# Patient Record
Sex: Female | Born: 1935 | Race: White | Hispanic: No | Marital: Married | Smoking: Former smoker
Health system: Southern US, Community
[De-identification: ages and names within clinical notes are randomized; demographics above are authoritative.]

## PROBLEM LIST (undated history)

## (undated) DIAGNOSIS — K219 Gastro-esophageal reflux disease without esophagitis: Secondary | ICD-10-CM

## (undated) DIAGNOSIS — J309 Allergic rhinitis, unspecified: Secondary | ICD-10-CM

## (undated) DIAGNOSIS — G459 Transient cerebral ischemic attack, unspecified: Secondary | ICD-10-CM

## (undated) DIAGNOSIS — F32A Depression, unspecified: Secondary | ICD-10-CM

## (undated) DIAGNOSIS — F329 Major depressive disorder, single episode, unspecified: Secondary | ICD-10-CM

## (undated) DIAGNOSIS — K635 Polyp of colon: Secondary | ICD-10-CM

## (undated) DIAGNOSIS — M48062 Spinal stenosis, lumbar region with neurogenic claudication: Secondary | ICD-10-CM

## (undated) DIAGNOSIS — L719 Rosacea, unspecified: Secondary | ICD-10-CM

## (undated) DIAGNOSIS — M797 Fibromyalgia: Secondary | ICD-10-CM

## (undated) DIAGNOSIS — G2581 Restless legs syndrome: Secondary | ICD-10-CM

## (undated) DIAGNOSIS — G4700 Insomnia, unspecified: Secondary | ICD-10-CM

## (undated) DIAGNOSIS — M47812 Spondylosis without myelopathy or radiculopathy, cervical region: Secondary | ICD-10-CM

## (undated) DIAGNOSIS — G603 Idiopathic progressive neuropathy: Secondary | ICD-10-CM

## (undated) DIAGNOSIS — E039 Hypothyroidism, unspecified: Secondary | ICD-10-CM

## (undated) DIAGNOSIS — K5790 Diverticulosis of intestine, part unspecified, without perforation or abscess without bleeding: Secondary | ICD-10-CM

## (undated) DIAGNOSIS — I10 Essential (primary) hypertension: Secondary | ICD-10-CM

## (undated) DIAGNOSIS — M5030 Other cervical disc degeneration, unspecified cervical region: Secondary | ICD-10-CM

## (undated) DIAGNOSIS — M1990 Unspecified osteoarthritis, unspecified site: Secondary | ICD-10-CM

## (undated) DIAGNOSIS — E785 Hyperlipidemia, unspecified: Secondary | ICD-10-CM

## (undated) DIAGNOSIS — K141 Geographic tongue: Secondary | ICD-10-CM

## (undated) HISTORY — DX: Unspecified osteoarthritis, unspecified site: M19.90

## (undated) HISTORY — PX: BILATERAL SALPINGOOPHORECTOMY: SHX1223

## (undated) HISTORY — PX: VESICOVAGINAL FISTULA CLOSURE W/ TAH: SUR271

## (undated) HISTORY — DX: Rosacea, unspecified: L71.9

## (undated) HISTORY — DX: Gastro-esophageal reflux disease without esophagitis: K21.9

## (undated) HISTORY — DX: Geographic tongue: K14.1

## (undated) HISTORY — DX: Diverticulosis of intestine, part unspecified, without perforation or abscess without bleeding: K57.90

## (undated) HISTORY — PX: CHOLECYSTECTOMY: SHX55

## (undated) HISTORY — DX: Insomnia, unspecified: G47.00

## (undated) HISTORY — DX: Hypothyroidism, unspecified: E03.9

## (undated) HISTORY — DX: Idiopathic progressive neuropathy: G60.3

## (undated) HISTORY — DX: Allergic rhinitis, unspecified: J30.9

## (undated) HISTORY — DX: Transient cerebral ischemic attack, unspecified: G45.9

## (undated) HISTORY — DX: Spondylosis without myelopathy or radiculopathy, cervical region: M47.812

## (undated) HISTORY — DX: Other cervical disc degeneration, unspecified cervical region: M50.30

## (undated) HISTORY — PX: OTHER SURGICAL HISTORY: SHX169

## (undated) HISTORY — PX: APPENDECTOMY: SHX54

## (undated) HISTORY — DX: Spinal stenosis, lumbar region with neurogenic claudication: M48.062

## (undated) HISTORY — DX: Polyp of colon: K63.5

## (undated) HISTORY — DX: Restless legs syndrome: G25.81

## (undated) HISTORY — DX: Hyperlipidemia, unspecified: E78.5

---

## 2000-08-30 ENCOUNTER — Other Ambulatory Visit: Admission: RE | Admit: 2000-08-30 | Discharge: 2000-08-30 | Payer: Self-pay | Admitting: Gynecology

## 2001-10-07 ENCOUNTER — Encounter: Payer: Self-pay | Admitting: Orthopedic Surgery

## 2001-10-14 ENCOUNTER — Encounter: Payer: Self-pay | Admitting: Orthopedic Surgery

## 2001-10-14 ENCOUNTER — Inpatient Hospital Stay (HOSPITAL_COMMUNITY): Admission: RE | Admit: 2001-10-14 | Discharge: 2001-10-15 | Payer: Self-pay | Admitting: Orthopedic Surgery

## 2001-12-07 ENCOUNTER — Emergency Department (HOSPITAL_COMMUNITY): Admission: EM | Admit: 2001-12-07 | Discharge: 2001-12-07 | Payer: Self-pay | Admitting: Emergency Medicine

## 2003-09-18 ENCOUNTER — Other Ambulatory Visit: Admission: RE | Admit: 2003-09-18 | Discharge: 2003-09-18 | Payer: Self-pay | Admitting: Gynecology

## 2008-03-21 ENCOUNTER — Inpatient Hospital Stay (HOSPITAL_COMMUNITY): Admission: RE | Admit: 2008-03-21 | Discharge: 2008-03-25 | Payer: Self-pay | Admitting: Orthopedic Surgery

## 2009-04-09 ENCOUNTER — Encounter: Admission: RE | Admit: 2009-04-09 | Discharge: 2009-04-09 | Payer: Self-pay | Admitting: Orthopedic Surgery

## 2010-09-24 ENCOUNTER — Ambulatory Visit
Admission: RE | Admit: 2010-09-24 | Discharge: 2010-09-24 | Payer: Self-pay | Source: Home / Self Care | Attending: Orthopedic Surgery | Admitting: Orthopedic Surgery

## 2010-11-02 ENCOUNTER — Encounter: Payer: Self-pay | Admitting: Orthopedic Surgery

## 2010-11-12 ENCOUNTER — Ambulatory Visit (HOSPITAL_BASED_OUTPATIENT_CLINIC_OR_DEPARTMENT_OTHER)
Admission: RE | Admit: 2010-11-12 | Discharge: 2010-11-12 | Disposition: A | Discharge status: Home / Self Care | Payer: Medicare Other | Source: Ambulatory Visit | Attending: Orthopedic Surgery | Admitting: Orthopedic Surgery

## 2010-11-12 DIAGNOSIS — I10 Essential (primary) hypertension: Secondary | ICD-10-CM | POA: Insufficient documentation

## 2010-11-12 DIAGNOSIS — G2581 Restless legs syndrome: Secondary | ICD-10-CM | POA: Insufficient documentation

## 2010-11-12 DIAGNOSIS — Z96659 Presence of unspecified artificial knee joint: Secondary | ICD-10-CM | POA: Insufficient documentation

## 2010-11-12 DIAGNOSIS — K219 Gastro-esophageal reflux disease without esophagitis: Secondary | ICD-10-CM | POA: Insufficient documentation

## 2010-11-12 DIAGNOSIS — M659 Unspecified synovitis and tenosynovitis, unspecified site: Secondary | ICD-10-CM | POA: Insufficient documentation

## 2010-11-12 DIAGNOSIS — IMO0001 Reserved for inherently not codable concepts without codable children: Secondary | ICD-10-CM | POA: Insufficient documentation

## 2010-11-12 DIAGNOSIS — Z8673 Personal history of transient ischemic attack (TIA), and cerebral infarction without residual deficits: Secondary | ICD-10-CM | POA: Insufficient documentation

## 2010-11-12 LAB — POCT I-STAT, CHEM 8
BUN: 16 mg/dL (ref 6–23)
Calcium, Ion: 1.19 mmol/L (ref 1.12–1.32)
Chloride: 106 mEq/L (ref 96–112)
Glucose, Bld: 95 mg/dL (ref 70–99)

## 2010-11-26 NOTE — Op Note (Signed)
  NAME:  Kiara Taylor, Kiara Taylor              ACCOUNT NO.:  [id number]  MEDICAL RECORD NO.:  [id number]          PATIENT TYPE:  AMB  LOCATION:  [facility]                         FACILITY:  [facility]  PHYSICIAN:  Ollen Gross, M.D.    DATE OF BIRTH:  05-17-1933  DATE OF PROCEDURE:  11/12/2010 DATE OF DISCHARGE:                              OPERATIVE REPORT   PREOPERATIVE DIAGNOSIS:  Right knee hypertrophic synovitis.  POSTOPERATIVE DIAGNOSIS:  Right knee hypertrophic synovitis.  PROCEDURE:  Right knee scope and synovectomy.  SURGEON:  Ollen Gross, MD  ASSISTANT:  None.  ANESTHESIA:  General.  ESTIMATED BLOOD LOSS:  Minimal.  DRAIN:  None.  COMPLICATIONS:  None.  CONDITION:  Stable to Recovery.  BRIEF CLINICAL NOTE:  Ms. [name] is a 77 year old female who had a right total knee arthroplasty done approximately 2 years ago.  She had developed painful popping in the knee.  It was felt that she had hypertrophic synovitis or the patellar clunk syndrome.  She presents now for scope and synovectomy.  PROCEDURE IN DETAIL:  After successful administration of general anesthetic, a tourniquet was placed high on the right thigh, right lower extremity prepped and draped in the usual sterile fashion.  Standard superomedial and inferolateral incisions were made, inflow cannula passed superomedial and camera passed inferolateral.  Arthroscopic visualization proceeds.  There was a large nodule of synovial tissue at the junction of the quadriceps tendon and the patella.  This was blocking the suprapatellar pouch.  A superolateral portal was created with an 11 blade and then the shaver passed through.  This was debrided back to normal tissue with a shaver and then the ArthroCare device was used to seal this off and cauterized the tissue.  We then went down into the infrapatellar area and removed a lot of scar tissue from there.  The median and lateral gutters were essentially free of synovial tissue. The  space was nicely cleared out, compared to the preoperative status. The arthroscopic equipments were then removed from the lateral portals which were closed with interrupted 4-0 nylon.  A 20 cc of 0.25% Marcaine with epinephrine injected through inflow cannula that was removed and that portal closed with nylon.  A bulky sterile dressing was applied and she was awakened and transported to Recovery in stable condition.     Ollen Gross, M.D.     FA/MEDQ  D:  11/12/2010  T:  11/12/2010  Job:  [id number]  Electronically Signed by Ollen Gross M.D. on 11/26/2010 02:53:06 PM

## 2010-12-22 LAB — POCT I-STAT 4, (NA,K, GLUC, HGB,HCT)
HCT: 44 % (ref 36.0–46.0)
Hemoglobin: 15 g/dL (ref 12.0–15.0)
Sodium: 142 mEq/L (ref 135–145)

## 2011-02-24 NOTE — Op Note (Signed)
NAME:  Kiara Taylor, Kiara Taylor              ACCOUNT NO.:  [id number]   MEDICAL RECORD NO.:  [id number]          PATIENT TYPE:  INP   LOCATION:  1605                         [facility]ACILITY:  [facility]   PHYSICIAN:  Ollen Gross, M.D.    DATE OF BIRTH:  1933-09-22   DATE OF PROCEDURE:  03/21/2008  DATE OF DISCHARGE:                               OPERATIVE REPORT   PREOPERATIVE DIAGNOSIS:  Osteoarthritis, right knee.   POSTOPERATIVE DIAGNOSIS:  Osteoarthritis, right knee.   PROCEDURE:  Right total knee arthroplasty.   SURGEON:  Ollen Gross, M.D.   ASSISTANT:  Jamelle Rushing, P.A.   ANESTHESIA:  General with postop Marcaine pain pump.   ESTIMATED BLOOD LOSS:  Minimal.   DRAINS:  None.   TOURNIQUET TIME:  Thirty-two minutes at 300 mmHg.   COMPLICATIONS:  None.   CONDITION:  Stable to the recovery room.   CLINICAL NOTE:  Ms. Disbro is a 77 year old female with end-stage  arthritis of the right knee with progressively worsening pain and  dysfunction.  She has failed nonoperative management and presents with  total knee arthroplasty.   PROCEDURE IN DETAIL:  After successful administration of general  anesthetic, a tourniquet is placed high on her right thigh, and her  right lower extremity prepped and draped in the usual sterile fashion.  The extremity is wrapped in esmarch, the knee flexed, and the tourniquet  inflated to 300 mmHg.  A midline incision was made with a 10 blade  through the subcutaneous tissue to the level of the extensor mechanism.  A fresh blade is used to make a medial parapatellar arthrotomy.  The  soft tissue over the proximal medial tibia is subperiosteally elevated  to the joint line with a knife into the semimembranosus bursa with a  Cobb elevator.  The soft tissue is elevated laterally with attention  being paid to avoid the patellar tendon on the tibial tubercle.  The  patella is subluxed laterally, and the knee flexed to 90 degrees.  ACL  and PCL removed.  The drill  is used to create a starting hole in the  distal femur, and the canal is thoroughly irrigated.  A 5 degree right  valgus alignment guide is placed, and referencing off the posterior  condyle, rotation is marked in a block pin to remove 10 mm off the  distal femur.  Distal femoral resection is made with an oscillating saw.  A sizing block is placed, and size 2.5 is most appropriate.  Rotation is  marked off the epicondylar axis.  A size 2.5 cutting block is placed,  and the anterior and posterior and chamfer cuts are made.   The tibia is subluxed forward, and menisci are removed.  An  extramedullary tibial alignment guide is placed, referencing proximally  at the medial aspect of the tibial tubercle and distally along the  second metatarsal axis of the tibial crest.  The block is pinned to  remove 10 mm off the nondeficient lateral side.  Tibial resection is  made with an oscillating saw.  The size 2.5 is also the most appropriate  tibial component,  and the proximal tibia is prepared with a modular  drill and keel punch for a size 2.5.  Femoral preparation is completed  with the intercondylar cut.   A size 2.5 mobile-bearing tibial trial, a 2.5 posterior stabilized  femoral trial, and a 10 mm posterior stabilized rotating platform insert  trial are placed.  With the 10, full extension is achieved with  excellent varus and valgus anterior and posterior balance throughout  with full range of motion.  The patella is then everted, and thickness  measured to be 21 mm.  Free-hand resection is taken to 12 mm, a 35  template is placed.  Lug holes are drilled.  The trial patella is  placed, and it tracks normally.  The osteophytes are removed off the  posterior femur with a trial in place.  All trials were removed, and the  cut-bone surface is prepared with pulsatile lavage.  Cement is mixed,  and once ready for implantation, a size 2.5 mobile-bearing tibial tray,  size 2.5 posterior stabilized  femur, and 35 patella are cemented into  place.  The patella is held with a clamp.  A trial 10 mm insert is  placed.  The knee held in full extension, and all extruded cement is  removed.  Once the cement is fully hardened, then the permanent 10 mm  posterior stabilized rotating platform insert is placed into the tibial  tray.  The wound is copiously irrigated with saline solution, and  FloSeal injected into the posterior capsule, suprapatellar area, medial  and lateral gutters.  Moist sponges placed, and tourniquet loosed for a  total time of 32 minutes.  The sponge is held for 2 minutes, then  removed.  Minimal bleeding is encountered.  That which is encountered is  stopped with electrocautery.  The wound is then further irrigated, and  the arthrotomy closed with interrupted #1 PDS.  Flexion against gravity  is 140 degrees.  The subcu is closed with interrupted 2-0 Vicryl and the  subcuticular with running 4-0 Monocryl.  The incision is cleaned and  dried, and Steri-Strips and a bulky sterile dressing are applied.  Prior  to placing the dressing, the Marcaine pain pump was placed, and the pump  was initiated.  She was then placed into a knee immobilizer, awakened,  and transported to recovery in stable condition.      Ollen Gross, M.D.  Electronically Signed     FA/MEDQ  D:  03/21/2008  T:  03/21/2008  Job:  [id number]

## 2011-02-24 NOTE — H&P (Signed)
NAME:  Kiara Taylor, Kiara Taylor              ACCOUNT NO.:  [id number]   MEDICAL RECORD NO.:  [id number]          PATIENT TYPE:  INP   LOCATION:  NA                           FACILITY:  [facility]   PHYSICIAN:  Ollen Gross, M.D.    DATE OF BIRTH:  August 23, 1933   DATE OF ADMISSION:  03/21/2008  DATE OF DISCHARGE:                              HISTORY & PHYSICAL   DATE OF OFFICE VISIT/HISTORY/PHYSICAL:  March 13, 2008.   CHIEF COMPLAINT:  Right knee pain.   HISTORY OF PRESENT ILLNESS:  The patient is a 78 year old female who has  been seen by Dr. Lequita Halt for ongoing bilateral knee pain.  The right is  more symptomatic and problematic than the left.  She has been treated  conservatively in the past.  She is found have end-stage arthritis in  the medial compartment of the right knee with apparent osteochondral  defect in the medial compartment and also bone-on-bone patellofemoral  compartments.  She does have mild changes in the left knee.  The right  knee is more symptomatic/problematic.  It is felt she would benefit from  undergoing knee replacement.  Risks and benefits have been discussed.  She elects to proceed with surgery.   ALLERGIES:  SULFA.   CURRENT MEDICATIONS:  1. Aleve.  2. Allegra.  3. Aspirin.  4. B complex.  5. Cymbalta.  6. Flonase nasal spray.  7. Furosemide.  8. Iron.  9. Potassium.  10.Lasix.  11.Magnesium citrate.  12.Micardis.  13.Multivitamin.  14.Neurontin.  15.Omega III fish oil.  16.Omeprazole.  17.Premarin.  18.Requip.  19.Synthroid.   PAST MEDICAL HISTORY:  1. Gastroesophageal reflux disease.  2. History of depression/anxiety.  3. Fibromyalgia.  4. Hypothyroidism.  5. Restless leg syndrome.  6. Hypertension.   PAST SURGICAL HISTORY:  1. Hysterectomy.  2. Appendectomy   SOCIAL HISTORY:  Noncontributory.   FAMILY PHYSICIAN:  Dr. Nadine Counts in [place], [place].   FAMILY HISTORY:  Not documented and not available at the time of   dictation.   REVIEW OF SYSTEMS:  GENERAL:  No fevers, chills, night sweats.  NEURO:  No seizures, syncope or paralysis.  RESPIRATORY:  No shortness breath,  productive cough or hemoptysis.  CARDIOVASCULAR:  No chest pain or  orthopnea.  GI:  No nausea, vomiting, diarrhea  or constipation.  GU:  No dysuria, hematuria, discharge.  MUSCULOSKELETAL:  Right knee.   PHYSICAL EXAMINATION:  VITAL SIGNS:  Pulse 68, respirations 12, blood  pressure 124/68.  GENERAL:  A 77 year old white female well-nourished, well-developed in  no acute distress, slightly overweight, accompanied by her husband.  HEENT:  Normocephalic, atraumatic.  Pupils are equal, round and  reactive.  Sclerae are clear.  Oropharynx clear.  EOMs intact.  NECK:  Supple.  CHEST:  Clear.  HEART:  Regular rate and rhythm, no murmur.  ABDOMEN:  Soft, round.  Bowel sounds are present.  RECTAL/GENITALIA:  Not done and not pertinent to present illness.  EXTREMITIES:  Right knee varus malalignment deformity.  Range of motion  5-110, moderate crepitus, tender more medial than lateral.   IMPRESSION:  1. Osteoarthritis, right  knee greater than left knee.  2. Gastroesophageal reflux disease.  3. Asthma.  4. Depression/anxiety.  5. Hypertension.  6. Venous insufficiency.  7. Fibromyalgia.  8. Hypothyroidism.  9. Postmenopausal.  10.Restless leg syndrome.   PLAN:  The patient admitted to [facility] to undergo a right  total knee replacement arthroplasty.  Surgery will be performed by Dr.  Ollen Gross.  She has been seen preoperatively by Dr. Nadine Counts and  felt to be stable for upcoming surgery.      Alexzandrew L. Perkins, P.A.C.      Ollen Gross, M.D.  Electronically Signed    ALP/MEDQ  D:  03/16/2008  T:  03/17/2008  Job:  [id number]   cc:   Ollen Gross, M.D.  Fax: [phone]   Nadine Counts  Fax: [phone]

## 2011-02-27 NOTE — Op Note (Signed)
[facility]  Patient:    Kiara Taylor, Kiara Taylor Visit Number: [id number] MRN: [id number]          Service Type: SUR Location: 4W [id number] 01 Attending Physician:  Sherri Rad Dictated by:   Sherri Rad, M.D. Proc. Date: 10/14/01 Admit Date:  10/14/2001                             Operative Report  PREOPERATIVE DIAGNOSES:  1. Left Haglund deformity.  2. Left digastric.  POSTOPERATIVE DIAGNOSES:  1. Left Haglund deformity.  2. Left digastric.  OPERATION PERFORMED:  1. Excision of left Haglund deformity.  2. Left gastroc slide.  ANESTHESIA:  General endotracheal tube.  SURGEON:  Sherri Rad, M.D.  ASSISTANT:  Ottie Glazier. Wynona Neat, P.A.-C.  ESTIMATED BLOOD LOSS:  Minimal.  TOURNIQUET TIME:  None.  COMPLICATIONS:  None.  DISPOSITION:  Stable to P.R.  INDICATIONS FOR PROCEDURE:  This is a 77 year old female who has had longstanding posterior heel pain for the past 8-10 months. She has tried a Cam walker, heel lifts, Vioxx all of which have not helped. She has pain that is bothering her daily and it is interfering with her life to point she can not do what she wants to. She was consented for the above procedure. All of the risks which include infection, neurovascular injury, persistent pain, worsening of pain, stiffness, contracture, tendon rupture were all explained and questions are answered.  DESCRIPTION OF PROCEDURE:  The patient was brought to the operating room, placed in the supine position after adequate general endotracheal anesthesia was administered as well as Ancef 1 gm IV ______. The left lower extremity was then prepped and draped in a sterile manner over approximate placed thigh tourniquet which was not used. The patient was then placed in a prone position. The procedure commenced with a medial incision over the medial aspect of the gastroc muscle tendonous junction. Dissection was carried down through adipose tissue to  fascia. Hemostasis was obtained. The fascia was then opened in line with the incision. The gastroc muscle tendonous junction was identified. The soft tissues were elevated off the posterior aspect of the gastroc tendon, the sural nerve was identified and protected throughout the case. The gastroc tendon was then released with the Mayo scissors completely. This corrected the tight gastroc and this was confirmed with exam. We then copiously irrigated the wound with normal saline, the skin was closed with 3-0 Vicryl subcu stitch. The skin was closed with 4-0 monocryl subcuticular stitch. We then made a longitudinal incision over the anterior lateral aspect of the Achilles tendon. Dissection was carried down to the calcaneal tuber. The retrocalcaneal bursa was identified and removed. The Achilles tendon was then elevated slightly off the posterior aspect of the calcaneal tuber. A 1 cm ______ was then placed, and then using an oscillating saw, the Haglunds deformity was then removed. Once this was removed, the corners were then rounded off with the rongeur and the remaining portion of the Haglunds resection was done with an 8 mm straight osteotome protecting the Achilles tendon. The Achilles tendon was intact throughout. The wound is copiously irrigated with normal saline. Lateral view with the C-arm was obtained and showed adequate resection of the Haglunds deformity as well. The skin was closed with 4-0 nylon 2 and 2 step stitch. A sterile dressing was applied. Jones dressing was applied. The patient was table P.R. Dictated by:   Worthy Rancher  Lestine Box, M.D. Attending Physician:  Sherri Rad DD:  10/14/01 TD:  10/14/01 Job: [id number]

## 2011-02-27 NOTE — Discharge Summary (Signed)
NAME:  MYLANI, GENTRY              ACCOUNT NO.:  [id number]   MEDICAL RECORD NO.:  [id number]          PATIENT TYPE:  INP   LOCATION:  1605                         FACILITY:  [facility]   PHYSICIAN:  Ollen Gross, M.D.    DATE OF BIRTH:  07-25-1933   DATE OF ADMISSION:  03/21/2008  DATE OF DISCHARGE:  03/25/2008                               DISCHARGE SUMMARY   ADMITTING DIAGNOSES:  1. Osteoarthritis right knee greater than left knee.  2. Gastroesophageal reflux disease.  3. Asthma.  4. Depression.  5. Anxiety.  6. Hypertension.  7. Venous insufficiency.  8. Fibromyalgia.  9. Hypothyroidism.  10.Postmenopausal.  11.Restless leg syndrome.   DISCHARGE DIAGNOSES:  1. Osteoarthritis right knee, status post right total knee replacement      arthroplasty.  2. Osteoarthritis left knee.  3. Postoperative blood loss anemia.  4. Status post transfusion.  5. Mild postoperative hyponatremia.  6. Gastroesophageal reflux disease.  7. Asthma.  8. Depression.  9. Anxiety.  10.Hypertension.  11.Venous insufficiency.  12.Fibromyalgia.  13.Hypothyroidism.  14.Postmenopausal.  15.Restless leg syndrome.   PROCEDURE:  On March 21, 2008 right total knee.  Surgeon:  Dr. Lequita Halt.  Assistant Jamelle Rushing, P.A.-C.Marland Kitchen  Anesthesia:  General.   CONSULTS:  None.   BRIEF HISTORY:  Ms. Cavallaro is a 77 year old female with end-stage  arthritis of the right knee, progressively worsening pain and  dysfunction.  Failed operative management, and now presents for right  total knee arthroplasty.   LABORATORY DATA:  Preop CBC showed hemoglobin 0.4, hematocrit 34.8,  white cell count 7.2, platelets 225, postop hemoglobin 9.8, then drifted  down to 8.5.  Given 2 units of blood.  Posttransfusion hemoglobin back  up to 10.3, with a hematocrit of 34.2.  PT/PTT preop 13.1 and 29,  respectively.  INR 1.  Serial protimes followed.  Last noted PT/INR 26.4  and 2.3.  Chem panel on admission all within normal limits.   Serial  BMETs were followed.  Sodium did drop down from 141 down to 135.  Last  noted 1 point lower at 134.  Albumin was low on admission at 3.  Remaining electrolytes within normal limits.  Preop UA negative.  Blood  group type O positive.   EKG dated February 09, 2008:  Sinus rhythm, RS transition zone in lead V5,  unconfirmed.   HOSPITAL COURSE:  The patient was admitted to [facility].  She  tolerated the procedure well, and later transported to the recovery room  and orthopedic floor.  Started on PCA and p.o. analgesics for pain  control following surgery.  He was given 24 hours of postop IV  antibiotics.  Started on Coumadin for DVT prophylaxis.  Doing pretty  well on the morning of day #1, had a little bit of hypotension, so her  blood pressure medications were held.  Given fluids for the low  pressure.  Started getting up out of bed, only took a few steps.  By day  #2, doing much better, much more alert.  Pressure was under better  control.  Dressing changed.  Incision looked good.  Discontinued the PCA  and the Foleys and IVs.  INR was already up to 2.3 at that point.  Doing  a little bit better on the mobility, walking about 20 feet.  Continued  to increase.  By day #3, incision was healing well.  Hemoglobin had  dropped down, though to 8.5.  Discussed transfusion.  It was felt that  the patient would benefit from undergoing blood.  Did receive 2 units.  Tolerated the blood well.  Felt much better by postop day #4, tolerating  medications, progressing with therapy 150 feet, specially after the  blood. and was discharged home.   DISCHARGE/PLAN:  1. The patient was discharged home on March 25, 2008.  2. Discharge diagnoses:  Please see above.  3. Discharge medications:  Resume home medications.   FOLLOWUP:  2 weeks.   ACTIVITY:  Weightbearing as tolerated, total knee protocol.   DISPOSITION:  Home.   CONDITION UPON DISCHARGE:  Improved.      Alexzandrew L.  Perkins, P.A.C.      Ollen Gross, M.D.  Electronically Signed    ALP/MEDQ  D:  04/11/2008  T:  04/11/2008  Job:  [id number]   cc:   Nadine Counts  Fax: [phone]

## 2011-04-29 ENCOUNTER — Inpatient Hospital Stay (HOSPITAL_COMMUNITY)
Admission: EM | Admit: 2011-04-29 | Discharge: 2011-04-30 | DRG: 392 | Disposition: A | Discharge status: Home / Self Care | Payer: Medicare Other | Attending: Surgery | Admitting: Surgery

## 2011-04-29 ENCOUNTER — Emergency Department (HOSPITAL_COMMUNITY): Payer: Medicare Other

## 2011-04-29 ENCOUNTER — Inpatient Hospital Stay (INDEPENDENT_AMBULATORY_CARE_PROVIDER_SITE_OTHER)
Admission: RE | Admit: 2011-04-29 | Discharge: 2011-04-29 | Disposition: A | Discharge status: Home / Self Care | Payer: Medicare Other | Source: Ambulatory Visit | Attending: Family Medicine | Admitting: Family Medicine

## 2011-04-29 DIAGNOSIS — R109 Unspecified abdominal pain: Secondary | ICD-10-CM

## 2011-04-29 DIAGNOSIS — I10 Essential (primary) hypertension: Secondary | ICD-10-CM | POA: Diagnosis present

## 2011-04-29 DIAGNOSIS — K219 Gastro-esophageal reflux disease without esophagitis: Secondary | ICD-10-CM | POA: Diagnosis present

## 2011-04-29 DIAGNOSIS — Z7982 Long term (current) use of aspirin: Secondary | ICD-10-CM

## 2011-04-29 DIAGNOSIS — R1013 Epigastric pain: Principal | ICD-10-CM | POA: Diagnosis present

## 2011-04-29 DIAGNOSIS — F3289 Other specified depressive episodes: Secondary | ICD-10-CM | POA: Diagnosis present

## 2011-04-29 DIAGNOSIS — F329 Major depressive disorder, single episode, unspecified: Secondary | ICD-10-CM | POA: Diagnosis present

## 2011-04-29 DIAGNOSIS — IMO0001 Reserved for inherently not codable concepts without codable children: Secondary | ICD-10-CM | POA: Diagnosis present

## 2011-04-29 DIAGNOSIS — J45909 Unspecified asthma, uncomplicated: Secondary | ICD-10-CM | POA: Diagnosis present

## 2011-04-29 DIAGNOSIS — Z96659 Presence of unspecified artificial knee joint: Secondary | ICD-10-CM

## 2011-04-29 DIAGNOSIS — E039 Hypothyroidism, unspecified: Secondary | ICD-10-CM | POA: Diagnosis present

## 2011-04-29 DIAGNOSIS — Z79899 Other long term (current) drug therapy: Secondary | ICD-10-CM

## 2011-04-29 DIAGNOSIS — Z882 Allergy status to sulfonamides status: Secondary | ICD-10-CM

## 2011-04-29 DIAGNOSIS — M129 Arthropathy, unspecified: Secondary | ICD-10-CM | POA: Diagnosis present

## 2011-04-29 DIAGNOSIS — K449 Diaphragmatic hernia without obstruction or gangrene: Secondary | ICD-10-CM | POA: Diagnosis present

## 2011-04-29 DIAGNOSIS — Z91041 Radiographic dye allergy status: Secondary | ICD-10-CM

## 2011-04-29 HISTORY — DX: Essential (primary) hypertension: I10

## 2011-04-29 HISTORY — DX: Major depressive disorder, single episode, unspecified: F32.9

## 2011-04-29 HISTORY — DX: Gastro-esophageal reflux disease without esophagitis: K21.9

## 2011-04-29 HISTORY — DX: Depression, unspecified: F32.A

## 2011-04-29 HISTORY — DX: Fibromyalgia: M79.7

## 2011-04-29 LAB — COMPREHENSIVE METABOLIC PANEL
ALT: 16 U/L (ref 0–35)
AST: 20 U/L (ref 0–37)
Alkaline Phosphatase: 73 U/L (ref 39–117)
CO2: 24 mEq/L (ref 19–32)
Chloride: 105 mEq/L (ref 96–112)
GFR calc Af Amer: >60 mL/min (ref >60)
GFR calc non Af Amer: >60 mL/min (ref >60)
Glucose, Bld: 145 mg/dL — ABNORMAL HIGH (ref 70–99)
Sodium: 139 mEq/L (ref 135–145)
Total Bilirubin: 0.3 mg/dL (ref 0.3–1.2)

## 2011-04-29 LAB — CBC
Hemoglobin: 11.7 g/dL — ABNORMAL LOW (ref 12.0–15.0)
MCH: 28.8 pg (ref 26.0–34.0)
MCHC: 33 g/dL (ref 30.0–36.0)
MCV: 87.4 fL (ref 78.0–100.0)
RBC: 4.06 MIL/uL (ref 3.87–5.11)

## 2011-04-29 LAB — TROPONIN I: Troponin I: <0.3 ng/mL (ref <0.30)

## 2011-04-29 LAB — DIFFERENTIAL
Eosinophils Absolute: 0.1 10*3/uL (ref 0.0–0.7)
Lymphs Abs: 3.3 10*3/uL (ref 0.7–4.0)
Monocytes Absolute: 0.7 10*3/uL (ref 0.1–1.0)
Monocytes Relative: 8 % (ref 3–12)
Neutrophils Relative %: 52 % (ref 43–77)

## 2011-04-30 ENCOUNTER — Encounter (HOSPITAL_COMMUNITY): Payer: Self-pay | Admitting: Radiology

## 2011-04-30 ENCOUNTER — Inpatient Hospital Stay (HOSPITAL_COMMUNITY): Payer: Medicare Other

## 2011-04-30 DIAGNOSIS — R109 Unspecified abdominal pain: Secondary | ICD-10-CM

## 2011-05-04 NOTE — H&P (Signed)
NAME:  Kiara Taylor, Kiara Taylor              ACCOUNT NO.:  [id number]  MEDICAL RECORD NO.:  [id number]  LOCATION:                                 FACILITY:  PHYSICIAN:  Abigail Miyamoto, M.D. DATE OF BIRTH:  03-Jan-1933  DATE OF ADMISSION:  04/30/2011 DATE OF DISCHARGE:                             HISTORY & PHYSICAL   CHIEF COMPLAINT:  Abdominal pain.  HISTORY:  This is a 78 year old female who presents with sudden onset of epigastric abdominal pain around 7 o'clock this past evening.  She reports the pain felt like a vice going around her abdomen and went to both left and right upper quadrants.  She developed nausea and vomiting with this.  She has had no similar history of abdominal pain since receiving medications in the emergency apartment.  She feels like her pain has decreased greatly and she no longer has the nausea.  The pain was severe at that time and she also reports numbness in her arm. Otherwise, she is without complaints.  She denied chest pain, shortness of breath.  PAST MEDICAL HISTORY: 1. Fibromyalgia. 2. Depression. 3. Hypertension. 4. Gastroesophageal reflux disease. 5. Hypothyroidism. 6. Arthritis. 7. Asthma.  PAST SURGICAL HISTORY:  Hysterectomy, bilateral knee replacements, appendectomy, and foot surgery.  Her last knee replacement was approximately 7 weeks ago.  FAMILY HISTORY:  Noncontributory.  Socially, she does not smoke and does not drink alcohol.  ALLERGIES:  SULFA.  MEDICATIONS:  Please see Universal medical reconciliation form.  This does include Lyrica, Percocet, and aspirin.  REVIEW OF SYSTEMS:  GENERAL:  Negative for fevers or chills.  PULMONARY: Negative for cough, shortness of breath, or difficulty breathing.  She does have a history of asthma.  CARDIAC:  Negative chest pain, irregular heartbeat.  ABDOMEN:  Listed as above.  There is no hematemesis. URINARY:  Negative for dysuria, hematuria.  The rest of review of systems including skin,  eyes, ears, nose, throat, musculoskeletal, neurologic, psychiatric, endocrine are negative except for history of fibromyalgia, joint pain with knee replacements.  PHYSICAL EXAMINATION:  GENERAL:  This is a morbidly obese female who is now comfortable. VITAL SIGNS:  Temperature 97.7, pulse 67, respiratory rate 14, blood pressure is 131/68. EYES:  Anicteric.  Pupils reactive bilaterally. ENT: External ears and nose are normal.  Hearing is normal.  Oropharynx is clear. NECK:  Supple.  Trachea is midline.  There is no thyromegaly. LUNGS:  Clear to auscultation bilaterally with normal respiratory effort. CARDIOVASCULAR:  Regular rate and rhythm.  There are no murmurs.  There is no peripheral edema. ABDOMEN:  Morbidly obese.  There is some very mild tenderness with guarding to deep palpation at the epigastrium, and questionably in the right upper quadrant.  There are no masses.  There are no hernias. There is no organomegaly. EXTREMITIES:  Warm and well perfused.  No edema, clubbing, or cyanosis. She has incisions over both of her knees which were well-healed. Peripheral pulses are intact to all four extremities. SKIN:  She has no rashes and no jaundice.  There is no edema, clubbing, or cyanosis in her extremities. PSYCHIATRIC:  Shows her to be awake, alert and oriented.  Judgment and affect appear normal.  LABORATORY DATA:  The patient has a white blood count of 8.6.  There is no left shift, hemoglobin is 11.7.  Liver function tests are normal. Lipase is normal.  Creatinine is 0.77.  Troponin is normal at less than 0.31.  The patient had an ultrasound of the abdomen which shows a 7 mm gallbladder wall.  There is no gallbladder wall fluid.  Gallstones were not seen.  The study is limited by body habitus.  ASSESSMENT:  This is a patient with abdominal pain of uncertain etiology, but certainly, this may represent acalculous cholecystitis versus acute gastroenteritis.  At this point, I  will admit her to the hospital.  I am going to go ahead and get a CAT scan of her abdomen and pelvis to better evaluate her intraabdominal process given her morbid obesity.  We will also give her IV fluids.  Continue bowel rest.  She may need a HIDA scan to rule out cholecystitis.     Abigail Miyamoto, M.D.     DB/MEDQ  D:  04/30/2011  T:  04/30/2011  Job:  [id number]  Electronically Signed by Abigail Miyamoto M.D. on 05/04/2011 08:23:13 AM

## 2011-05-11 NOTE — Discharge Summary (Signed)
  NAMEMarland Kitchen  Kiara Taylor, Kiara Taylor              ACCOUNT NO.:  [id number]  MEDICAL RECORD NO.:  [id number]  LOCATION:  5128                         FACILITY:  [facility]  PHYSICIAN:  Sandria Bales. Ezzard Standing, M.D.  DATE OF BIRTH:  1933/09/01  DATE OF ADMISSION:  04/29/2011 DATE OF DISCHARGE:  04/30/2011                              DISCHARGE SUMMARY   ADMISSION DIAGNOSES: 1. Abdominal pain, uncertain etiology. 2. History of fibromyalgia. 3. History of depression. 4. Hypertension. 5. Gastroesophageal reflux disease. 6. Hypothyroid. 7. Arthritis. 8. Asthma.  DISCHARGE DIAGNOSES: 1. Abdominal pain, uncertain etiology. 2. History of fibromyalgia. 3. History of depression. 4. Hypertension. 5. Gastroesophageal reflux disease. 6. Hypothyroid. 7. Arthritis. 8. Asthma.  PROCEDURES: 1. Abdominal ultrasound showing findings suggestive of gallbladder     wall thickening, possibly related to cholecystitis. 2. CT of the abdomen which shows mildly distended gallbladder without     associated inflammatory changes.  No bowel obstruction status post     appendectomy, colonic diverticulosis, no diverticulitis, small     hiatal hernia.  BRIEF HISTORY:  The patient is a 77 year old female who presented with sudden onset of abdominal pain around 7:00 p.m. last night.  She reports pain felt like a vice going around her left and right upper quadrants. She developed nausea and vomiting.  No prior history.  She was brought to the emergency room for evaluation at that time.  The patient's white count was 8.6.  There was no left shift.  Hemoglobin was 11.  LFTs were normal.  Lipase was normal.  Creatinine 0.77.  Troponin was less than 0.31.  There was no gallbladder wall fluid, gallstones were not seen. The patient was subsequently admitted for further evaluation and treatment.  For further history and physical, please see the dictated note.  HOSPITAL COURSE:  The patient remained stable overnight with no  further nausea and vomiting.  She had some mild epigastric pain.  She was afebrile.  Vital signs were stable.  CT scan was obtained with the above- noted findings.  The patient was having no more abdominal pain or discomfort and after review of the study, it was Dr. Allene Pyo opinion that she could go home.    We will plan to let her follow up with Dr. Vevelyn Royals, her primary care.  She will resume her preadmission medicines as before with no changes.  CONDITION ON DISCHARGE:  Improved.   Eber Hong, P.A.   Sandria Bales. Ezzard Standing, M.D., FACS   WDJ/MEDQ  D:  04/30/2011  T:  05/01/2011  Job:  [id number]  cc:   Ollen Gross, M.D. Dr. Chryl Heck Dr. Greta Doom Dr. Vevelyn Royals  Electronically Signed by Sherrie George P.A. on 05/06/2011 02:52:56 PM Electronically Signed by Ovidio Kin M.D. on 05/11/2011 11:53:19 AM

## 2011-07-09 LAB — HEMOGLOBIN AND HEMATOCRIT, BLOOD: Hemoglobin: 10.3 — ABNORMAL LOW

## 2011-07-09 LAB — TYPE AND SCREEN: Antibody Screen: NEGATIVE

## 2011-07-09 LAB — CBC
HCT: 25.4 — ABNORMAL LOW
HCT: 28.7 — ABNORMAL LOW
HCT: 34.8 — ABNORMAL LOW
Hemoglobin: 8.5 — ABNORMAL LOW
MCHC: 32.7
MCHC: 32.8
MCV: 78.3
MCV: 78.3
Platelets: 159
Platelets: 177
Platelets: 225
RDW: 14.5
RDW: 15.3
WBC: 7.2
WBC: 7.2

## 2011-07-09 LAB — PROTIME-INR
INR: 1
INR: 2.3 — ABNORMAL HIGH
INR: 2.3 — ABNORMAL HIGH
Prothrombin Time: 14.6
Prothrombin Time: 26.4 — ABNORMAL HIGH

## 2011-07-09 LAB — APTT: aPTT: 29

## 2011-07-09 LAB — BASIC METABOLIC PANEL
BUN: 13
BUN: 15
CO2: 24
Calcium: 7.9 — ABNORMAL LOW
Chloride: 104
Creatinine, Ser: 1.03
GFR calc non Af Amer: 42 — ABNORMAL LOW
Glucose, Bld: 136 — ABNORMAL HIGH

## 2011-07-09 LAB — URINALYSIS, ROUTINE W REFLEX MICROSCOPIC
Bilirubin Urine: NEGATIVE
Glucose, UA: NEGATIVE
Hgb urine dipstick: NEGATIVE
Ketones, ur: NEGATIVE
Protein, ur: NEGATIVE

## 2011-07-09 LAB — COMPREHENSIVE METABOLIC PANEL
Albumin: 3 — ABNORMAL LOW
BUN: 11
Calcium: 9
Chloride: 108
Creatinine, Ser: 0.97
Total Bilirubin: 0.7

## 2011-07-09 LAB — PREPARE RBC (CROSSMATCH)

## 2011-10-20 DIAGNOSIS — M542 Cervicalgia: Secondary | ICD-10-CM | POA: Diagnosis not present

## 2011-10-20 DIAGNOSIS — E039 Hypothyroidism, unspecified: Secondary | ICD-10-CM | POA: Diagnosis not present

## 2011-10-20 DIAGNOSIS — I10 Essential (primary) hypertension: Secondary | ICD-10-CM | POA: Diagnosis not present

## 2011-10-20 DIAGNOSIS — K219 Gastro-esophageal reflux disease without esophagitis: Secondary | ICD-10-CM | POA: Diagnosis not present

## 2011-10-29 ENCOUNTER — Other Ambulatory Visit: Payer: Self-pay | Admitting: Obstetrics and Gynecology

## 2011-10-29 DIAGNOSIS — N644 Mastodynia: Secondary | ICD-10-CM | POA: Diagnosis not present

## 2011-10-29 DIAGNOSIS — N6459 Other signs and symptoms in breast: Secondary | ICD-10-CM

## 2011-10-29 DIAGNOSIS — N63 Unspecified lump in unspecified breast: Secondary | ICD-10-CM

## 2011-11-02 DIAGNOSIS — M5030 Other cervical disc degeneration, unspecified cervical region: Secondary | ICD-10-CM | POA: Diagnosis not present

## 2011-11-02 DIAGNOSIS — M25569 Pain in unspecified knee: Secondary | ICD-10-CM | POA: Diagnosis not present

## 2011-11-05 DIAGNOSIS — H538 Other visual disturbances: Secondary | ICD-10-CM | POA: Diagnosis not present

## 2011-11-10 DIAGNOSIS — I8310 Varicose veins of unspecified lower extremity with inflammation: Secondary | ICD-10-CM | POA: Diagnosis not present

## 2011-11-10 DIAGNOSIS — L820 Inflamed seborrheic keratosis: Secondary | ICD-10-CM | POA: Diagnosis not present

## 2011-11-12 DIAGNOSIS — H26499 Other secondary cataract, unspecified eye: Secondary | ICD-10-CM | POA: Diagnosis not present

## 2011-11-13 ENCOUNTER — Ambulatory Visit
Admission: RE | Admit: 2011-11-13 | Discharge: 2011-11-13 | Disposition: A | Discharge status: Home / Self Care | Payer: Medicare Other | Source: Ambulatory Visit | Attending: Obstetrics and Gynecology | Admitting: Obstetrics and Gynecology

## 2011-11-13 DIAGNOSIS — N63 Unspecified lump in unspecified breast: Secondary | ICD-10-CM

## 2011-11-13 DIAGNOSIS — N6459 Other signs and symptoms in breast: Secondary | ICD-10-CM

## 2011-11-13 DIAGNOSIS — N644 Mastodynia: Secondary | ICD-10-CM | POA: Diagnosis not present

## 2011-11-23 DIAGNOSIS — Z1212 Encounter for screening for malignant neoplasm of rectum: Secondary | ICD-10-CM | POA: Diagnosis not present

## 2011-11-23 DIAGNOSIS — Z1289 Encounter for screening for malignant neoplasm of other sites: Secondary | ICD-10-CM | POA: Diagnosis not present

## 2011-12-09 DIAGNOSIS — N390 Urinary tract infection, site not specified: Secondary | ICD-10-CM | POA: Diagnosis not present

## 2011-12-21 DIAGNOSIS — R0602 Shortness of breath: Secondary | ICD-10-CM | POA: Diagnosis not present

## 2011-12-21 DIAGNOSIS — I10 Essential (primary) hypertension: Secondary | ICD-10-CM | POA: Diagnosis not present

## 2011-12-21 DIAGNOSIS — I83893 Varicose veins of bilateral lower extremities with other complications: Secondary | ICD-10-CM | POA: Diagnosis not present

## 2011-12-21 DIAGNOSIS — N390 Urinary tract infection, site not specified: Secondary | ICD-10-CM | POA: Diagnosis not present

## 2011-12-24 DIAGNOSIS — Z79899 Other long term (current) drug therapy: Secondary | ICD-10-CM | POA: Diagnosis not present

## 2011-12-24 DIAGNOSIS — E039 Hypothyroidism, unspecified: Secondary | ICD-10-CM | POA: Diagnosis not present

## 2011-12-24 DIAGNOSIS — R0602 Shortness of breath: Secondary | ICD-10-CM | POA: Diagnosis not present

## 2011-12-24 DIAGNOSIS — E119 Type 2 diabetes mellitus without complications: Secondary | ICD-10-CM | POA: Diagnosis not present

## 2011-12-25 ENCOUNTER — Encounter: Payer: Self-pay | Admitting: Vascular Surgery

## 2011-12-25 ENCOUNTER — Other Ambulatory Visit: Payer: Self-pay

## 2011-12-25 DIAGNOSIS — I83893 Varicose veins of bilateral lower extremities with other complications: Secondary | ICD-10-CM

## 2011-12-28 ENCOUNTER — Ambulatory Visit (INDEPENDENT_AMBULATORY_CARE_PROVIDER_SITE_OTHER): Payer: Medicare Other | Admitting: Vascular Surgery

## 2011-12-28 ENCOUNTER — Encounter (INDEPENDENT_AMBULATORY_CARE_PROVIDER_SITE_OTHER): Payer: Medicare Other | Admitting: *Deleted

## 2011-12-28 ENCOUNTER — Encounter: Payer: Self-pay | Admitting: Vascular Surgery

## 2011-12-28 VITALS — BP 141/84 | HR 65 | Resp 16 | Ht 63.0 in | Wt 207.0 lb

## 2011-12-28 DIAGNOSIS — I83893 Varicose veins of bilateral lower extremities with other complications: Secondary | ICD-10-CM

## 2011-12-28 NOTE — Progress Notes (Signed)
Subjective:     Patient ID: Kiara Taylor, female   DOB: [birth year], 78 y.o.   MRN: [id number]  HPI this 78 year old female was referred by Dr.Prochnau for evaluation of venous insufficiency both legs left worse than right. Patient states she has swelling in both legs which worsens as the day progresses. She has no history of DVT or thrombophlebitis. She will occasionally wear elastic compression stockings but rarely. She has noted some bluish discoloration and a few surface veins particularly in the left leg and had one episode of bleeding following shaving her leg. She has no history of stasis ulcers or DVT or pulmonary embolus. Right leg has lesser symptoms than the left is not elevate her legs or regular basis.  Past Medical History  Diagnosis Date  . Hypertension   . Fibromyalgia   . Depression   . GERD (gastroesophageal reflux disease)   . Asthma     History  Substance Use Topics  . Smoking status: Former Smoker    Quit date: 10/13/1971  . Smokeless tobacco: Not on file  . Alcohol Use: 0.0 oz/week    0 Glasses of wine per week    History reviewed. No pertinent family history.  Allergies  Allergen Reactions  . Contrast Media (Iodinated Diagnostic Agents)   . Oxycodone   . Sulfa Antibiotics     Current outpatient prescriptions:aspirin 325 MG tablet, Take 325 mg by mouth daily., Disp: , Rfl: ;  atorvastatin (LIPITOR) 40 MG tablet, Take 40 mg by mouth daily., Disp: , Rfl: ;  estrogens, conjugated, (PREMARIN) 0.3 MG tablet, Take 0.3 mg by mouth daily. Take daily for 21 days then do not take for 7 days., Disp: , Rfl: ;  levothyroxine (SYNTHROID, LEVOTHROID) 75 MCG tablet, Take 75 mcg by mouth daily., Disp: , Rfl:  omeprazole (PRILOSEC) 40 MG capsule, Take 40 mg by mouth daily., Disp: , Rfl: ;  pregabalin (LYRICA) 75 MG capsule, Take 75 mg by mouth 2 (two) times daily., Disp: , Rfl: ;  telmisartan (MICARDIS) 40 MG tablet, Take 40 mg by mouth daily., Disp: , Rfl:   BP 141/84  Pulse  65  Resp 16  Ht 5\' 3"  (1.6 m)  Wt 207 lb (93.895 kg)  BMI 36.67 kg/m2  Body mass index is 36.67 kg/(m^2).         Review of Systems complains of dyspnea on exertion, reflux esophagitis, previous history of a "mini stroke in ", decreased visual acuity, joint pain, and weight gain. All other systems negative and a complete review of systems    Objective:   Physical Exam blood pressure 141/84 heart rate 65 respirations 16 Gen.-alert and oriented x3 in no apparent distress HEENT normal for age Lungs no rhonchi or wheezing Cardiovascular regular rhythm no murmurs carotid pulses 3+ palpable no bruits audible Abdomen soft nontender no palpable masses-OB Musculoskeletal free of  major deformities Skin clear -no rashes Neurologic normal Lower extremities 3+ femoral and dorsalis pedis pulses palpable bilaterally with 1+ edema-diffuse spider veins left worse than right in the pretibial regions and anterior thigh regions. No bulging varicosities noted. Some mild hyperpigmentation in the anterior aspect of the left pre-tibial region. No ulcerations noted.  Today I ordered bilateral venous duplex exam which I reviewed and interpreted. There is no significant reflux in the superficial venous system. She has severe reflux in the deep venous system at the common femoral vein level. There is no DVT.       Assessment:     Bilateral  spider veins with deep venous reflux    Plan:     #1 elevate legs at night #2 short leg elastic compression stockings #3 sclerotherapy if patient has further bleeding episodes which are spontaneous #4 return to see [place] on when necessary basis

## 2012-01-04 NOTE — Procedures (Unsigned)
LOWER EXTREMITY VENOUS REFLUX EXAM  INDICATION:  Swelling in the bilateral lower extremities with visible spider veins.  EXAM:  Using color-flow imaging and pulse Doppler spectral analysis, the bilateral common femoral, femoral, popliteal, posterior tibial, great and small saphenous veins are evaluated.  There is evidence suggesting deep venous insufficiency in the bilateral common femoral vein.  The left saphenofemoral junction is not competent with Reflux of >542milliseconds. The bilateral great saphenous vein is competent.  The bilateral proximal small saphenous vein demonstrates competency.  GSV Diameter (used if found to be incompetent only)                                           Right    Left Proximal Greater Saphenous Vein           cm       cm Proximal-to-mid-thigh                     cm       cm Mid thigh                                 cm       cm Mid-distal thigh                          cm       cm Distal thigh                              cm       cm Knee                                      cm       cm  IMPRESSION: 1. Bilateral great saphenous vein is competent. 2. The bilateral common femoral veins are not competent with Reflux of     >500 milliseconds. 3. The bilateral small saphenous vein is competent.  ___________________________________________ Quita Skye. Hart Rochester, M.D.  LT/MEDQ  D:  12/30/2011  T:  12/30/2011  Job:  [id number]

## 2012-01-11 DIAGNOSIS — H04129 Dry eye syndrome of unspecified lacrimal gland: Secondary | ICD-10-CM | POA: Diagnosis not present

## 2012-02-25 DIAGNOSIS — G2581 Restless legs syndrome: Secondary | ICD-10-CM | POA: Diagnosis not present

## 2012-02-25 DIAGNOSIS — G603 Idiopathic progressive neuropathy: Secondary | ICD-10-CM | POA: Diagnosis not present

## 2012-02-29 DIAGNOSIS — E785 Hyperlipidemia, unspecified: Secondary | ICD-10-CM | POA: Diagnosis not present

## 2012-02-29 DIAGNOSIS — K219 Gastro-esophageal reflux disease without esophagitis: Secondary | ICD-10-CM | POA: Diagnosis not present

## 2012-02-29 DIAGNOSIS — I10 Essential (primary) hypertension: Secondary | ICD-10-CM | POA: Diagnosis not present

## 2012-02-29 DIAGNOSIS — G2581 Restless legs syndrome: Secondary | ICD-10-CM | POA: Diagnosis not present

## 2012-02-29 DIAGNOSIS — Z79899 Other long term (current) drug therapy: Secondary | ICD-10-CM | POA: Diagnosis not present

## 2012-02-29 DIAGNOSIS — E039 Hypothyroidism, unspecified: Secondary | ICD-10-CM | POA: Diagnosis not present

## 2012-03-10 DIAGNOSIS — H26499 Other secondary cataract, unspecified eye: Secondary | ICD-10-CM | POA: Diagnosis not present

## 2012-03-10 DIAGNOSIS — I6529 Occlusion and stenosis of unspecified carotid artery: Secondary | ICD-10-CM | POA: Diagnosis not present

## 2012-03-15 ENCOUNTER — Encounter: Payer: Medicare Other | Admitting: Vascular Surgery

## 2012-03-17 DIAGNOSIS — E119 Type 2 diabetes mellitus without complications: Secondary | ICD-10-CM | POA: Diagnosis not present

## 2012-03-17 DIAGNOSIS — R110 Nausea: Secondary | ICD-10-CM | POA: Diagnosis not present

## 2012-03-17 DIAGNOSIS — R109 Unspecified abdominal pain: Secondary | ICD-10-CM | POA: Diagnosis not present

## 2012-03-17 DIAGNOSIS — G603 Idiopathic progressive neuropathy: Secondary | ICD-10-CM | POA: Diagnosis not present

## 2012-03-17 DIAGNOSIS — G2581 Restless legs syndrome: Secondary | ICD-10-CM | POA: Diagnosis not present

## 2012-03-17 DIAGNOSIS — R609 Edema, unspecified: Secondary | ICD-10-CM | POA: Diagnosis not present

## 2012-03-17 DIAGNOSIS — K140 Glossitis: Secondary | ICD-10-CM | POA: Diagnosis not present

## 2012-03-21 DIAGNOSIS — R109 Unspecified abdominal pain: Secondary | ICD-10-CM | POA: Diagnosis not present

## 2012-03-21 DIAGNOSIS — R110 Nausea: Secondary | ICD-10-CM | POA: Diagnosis not present

## 2012-03-31 DIAGNOSIS — M545 Low back pain: Secondary | ICD-10-CM | POA: Diagnosis not present

## 2012-03-31 DIAGNOSIS — E785 Hyperlipidemia, unspecified: Secondary | ICD-10-CM | POA: Diagnosis not present

## 2012-03-31 DIAGNOSIS — E039 Hypothyroidism, unspecified: Secondary | ICD-10-CM | POA: Diagnosis not present

## 2012-03-31 DIAGNOSIS — G2581 Restless legs syndrome: Secondary | ICD-10-CM | POA: Diagnosis not present

## 2012-04-11 DIAGNOSIS — K8010 Calculus of gallbladder with chronic cholecystitis without obstruction: Secondary | ICD-10-CM | POA: Diagnosis not present

## 2012-04-11 DIAGNOSIS — E669 Obesity, unspecified: Secondary | ICD-10-CM | POA: Diagnosis not present

## 2012-04-25 DIAGNOSIS — I471 Supraventricular tachycardia: Secondary | ICD-10-CM | POA: Diagnosis not present

## 2012-04-25 DIAGNOSIS — E669 Obesity, unspecified: Secondary | ICD-10-CM | POA: Diagnosis not present

## 2012-04-25 DIAGNOSIS — E039 Hypothyroidism, unspecified: Secondary | ICD-10-CM | POA: Diagnosis not present

## 2012-04-25 DIAGNOSIS — Z79899 Other long term (current) drug therapy: Secondary | ICD-10-CM | POA: Diagnosis not present

## 2012-04-25 DIAGNOSIS — K432 Incisional hernia without obstruction or gangrene: Secondary | ICD-10-CM | POA: Diagnosis not present

## 2012-04-25 DIAGNOSIS — Z7982 Long term (current) use of aspirin: Secondary | ICD-10-CM | POA: Diagnosis not present

## 2012-04-25 DIAGNOSIS — Z6834 Body mass index (BMI) 34.0-34.9, adult: Secondary | ICD-10-CM | POA: Diagnosis not present

## 2012-04-25 DIAGNOSIS — R0602 Shortness of breath: Secondary | ICD-10-CM | POA: Diagnosis not present

## 2012-04-25 DIAGNOSIS — K8010 Calculus of gallbladder with chronic cholecystitis without obstruction: Secondary | ICD-10-CM | POA: Diagnosis not present

## 2012-04-25 DIAGNOSIS — I10 Essential (primary) hypertension: Secondary | ICD-10-CM | POA: Diagnosis not present

## 2012-04-27 DIAGNOSIS — J309 Allergic rhinitis, unspecified: Secondary | ICD-10-CM | POA: Diagnosis not present

## 2012-04-27 DIAGNOSIS — R0602 Shortness of breath: Secondary | ICD-10-CM | POA: Diagnosis not present

## 2012-04-29 DIAGNOSIS — K8010 Calculus of gallbladder with chronic cholecystitis without obstruction: Secondary | ICD-10-CM | POA: Diagnosis not present

## 2012-04-29 DIAGNOSIS — I10 Essential (primary) hypertension: Secondary | ICD-10-CM | POA: Diagnosis not present

## 2012-04-29 DIAGNOSIS — Z7982 Long term (current) use of aspirin: Secondary | ICD-10-CM | POA: Diagnosis not present

## 2012-04-29 DIAGNOSIS — Z79899 Other long term (current) drug therapy: Secondary | ICD-10-CM | POA: Diagnosis not present

## 2012-04-29 DIAGNOSIS — K8020 Calculus of gallbladder without cholecystitis without obstruction: Secondary | ICD-10-CM | POA: Diagnosis not present

## 2012-04-29 DIAGNOSIS — I471 Supraventricular tachycardia: Secondary | ICD-10-CM | POA: Diagnosis not present

## 2012-04-29 DIAGNOSIS — K432 Incisional hernia without obstruction or gangrene: Secondary | ICD-10-CM | POA: Diagnosis not present

## 2012-04-29 DIAGNOSIS — K824 Cholesterolosis of gallbladder: Secondary | ICD-10-CM | POA: Diagnosis not present

## 2012-04-29 DIAGNOSIS — E039 Hypothyroidism, unspecified: Secondary | ICD-10-CM | POA: Diagnosis not present

## 2012-04-29 DIAGNOSIS — R0602 Shortness of breath: Secondary | ICD-10-CM | POA: Diagnosis not present

## 2012-05-05 DIAGNOSIS — J189 Pneumonia, unspecified organism: Secondary | ICD-10-CM | POA: Diagnosis not present

## 2012-05-08 IMAGING — US US ABDOMEN COMPLETE
1 series · 14 of 25 positions shown · non-contrast
Comparison: None.

CLINICAL DATA: Abdominal pain.  Nausea and vomiting.

COMPLETE ABDOMINAL ULTRASOUND

[Series 1: us abdomen complete · 0.30mm/px · 14 of 38 slices shown]
[im 1/38]
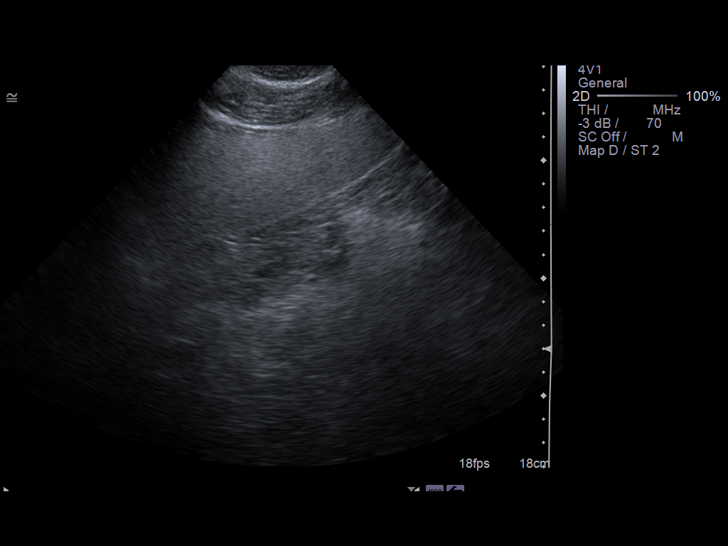
[im 4/38]
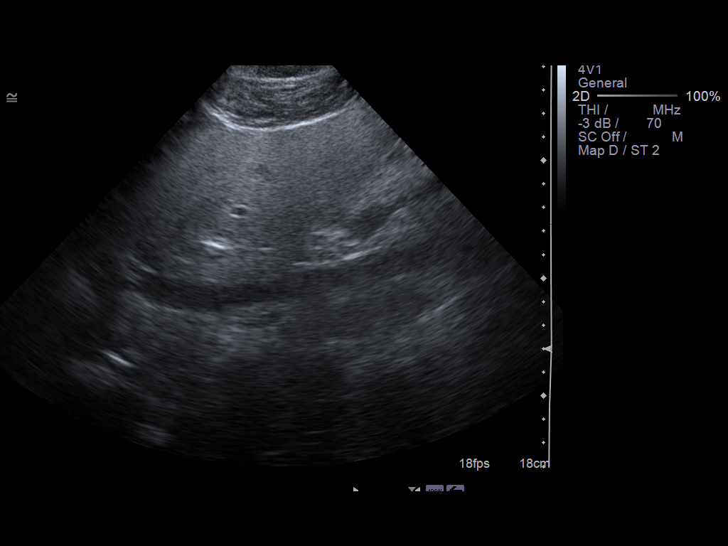
[im 7/38]
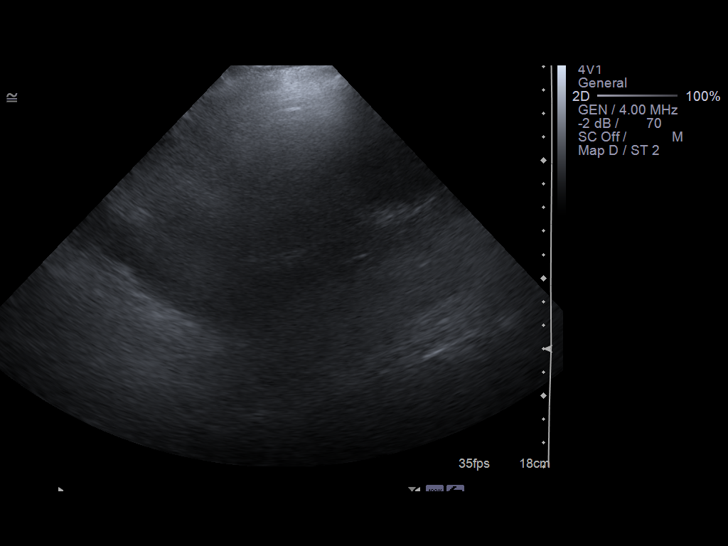
[im 10/38]
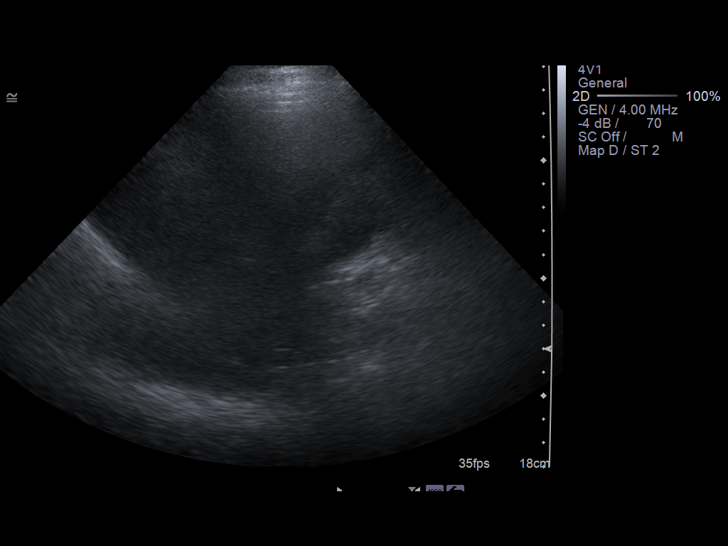
[im 13/38]
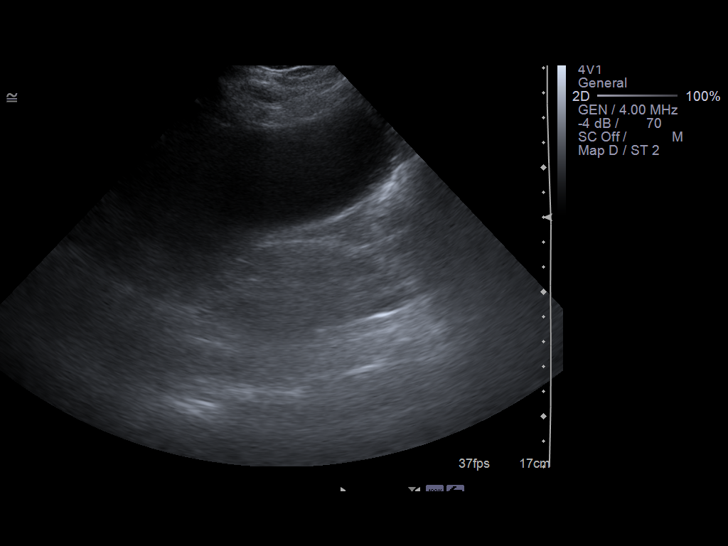
[im 14/38]
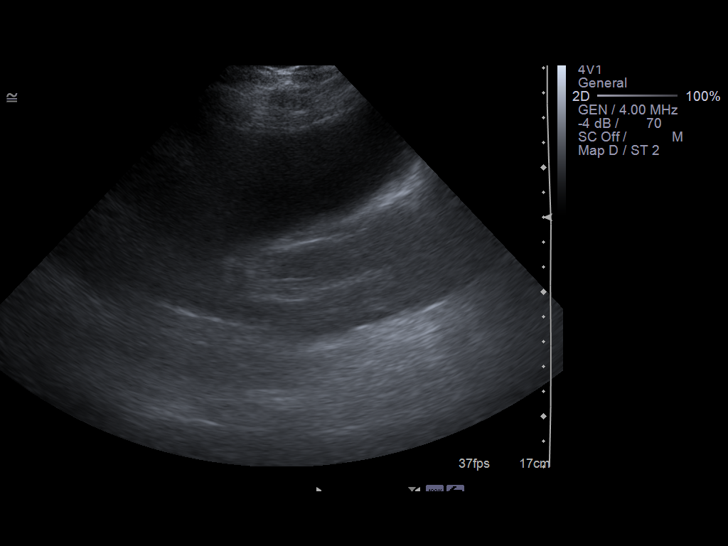
[im 17/38]
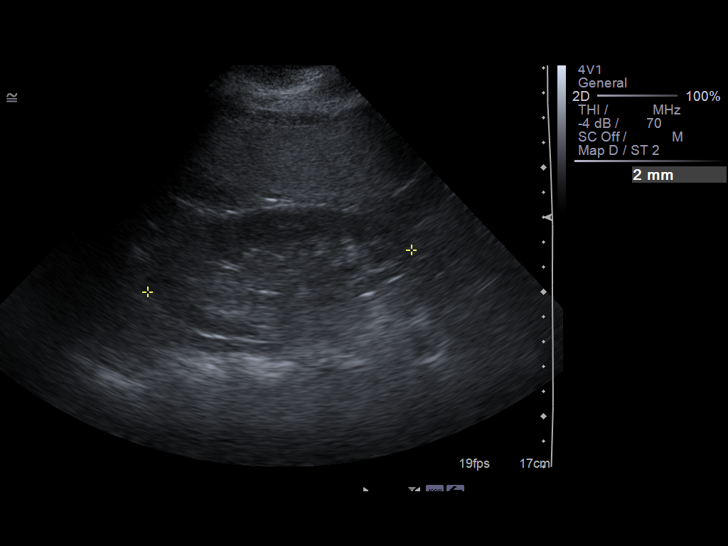
[im 21/38]
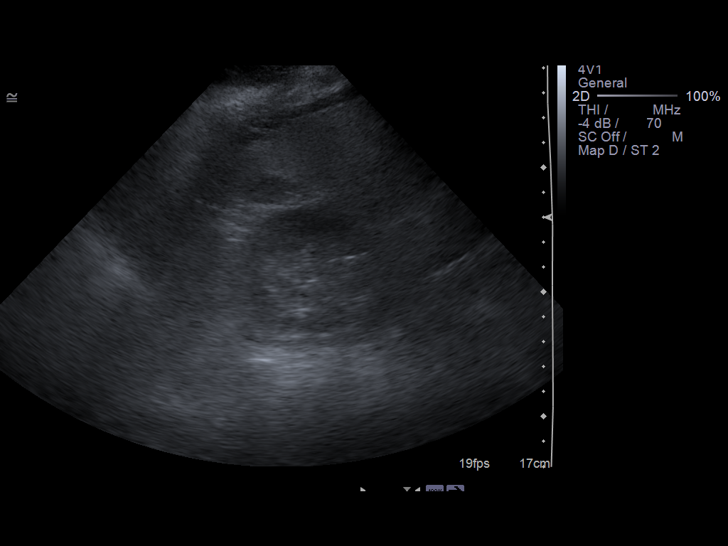
[im 24/38]
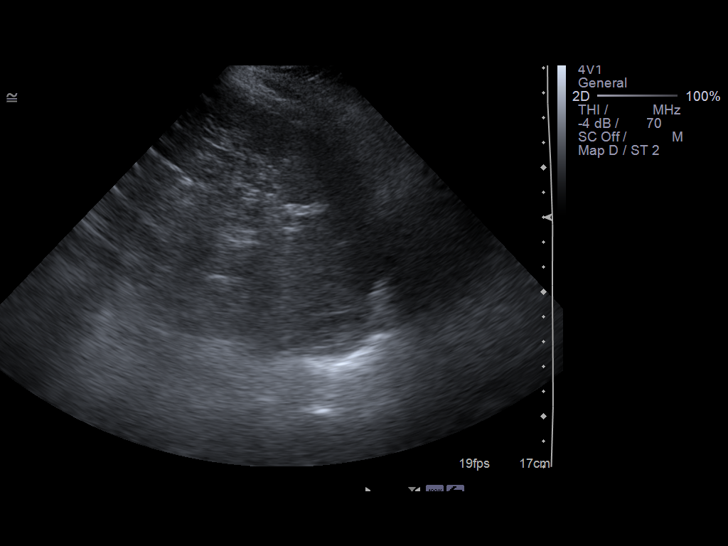
[im 25/38]
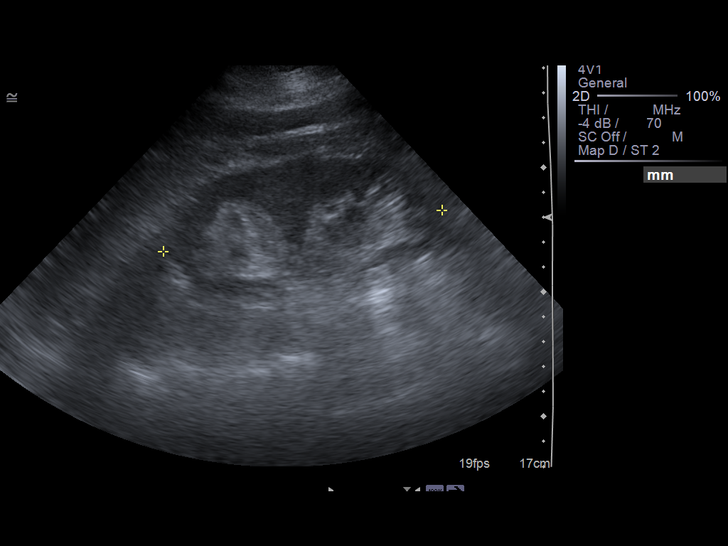
[im 28/38]
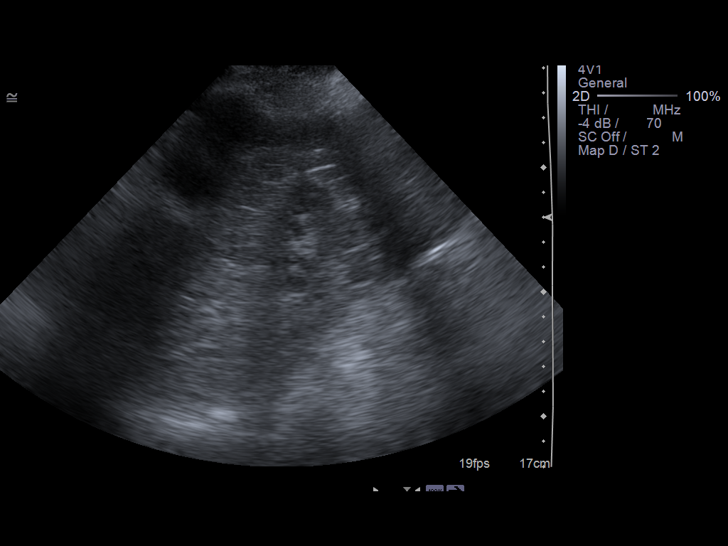
[im 31/38]
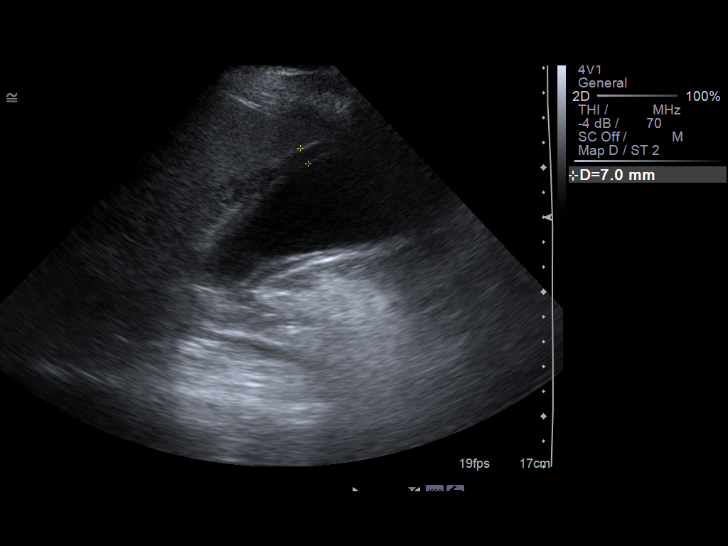
[im 34/38]
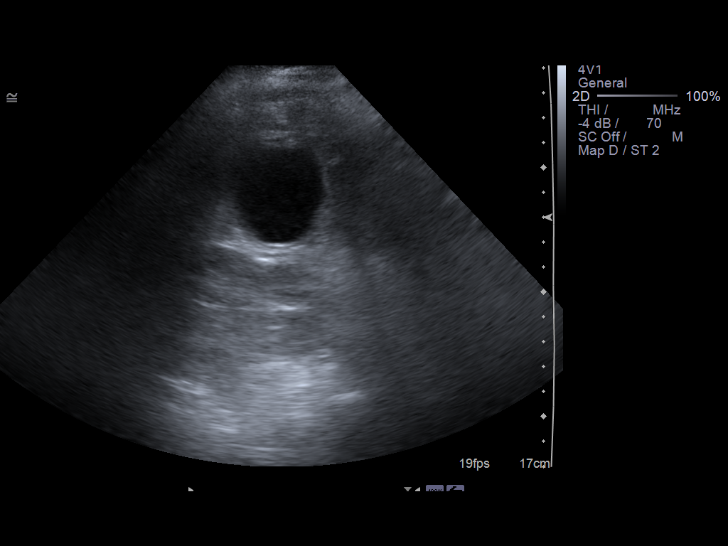
[im 38/38]
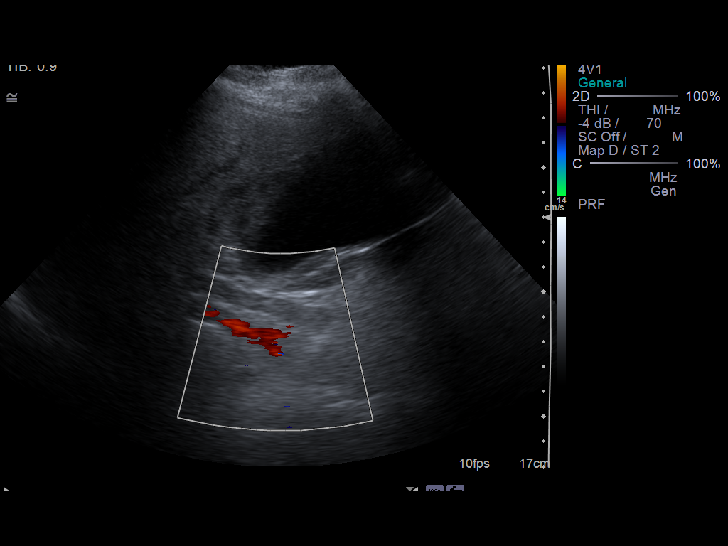

[14 of 25 positions shown; findings below may reference images not displayed]

FINDINGS: Gallbladder:  Evaluation of the gallbladder is limited by patient's
habitus and bowel gas.  The gallbladder wall appears thickened
measuring 7 mm.  No discrete stone visualized.

Common bile duct:  7 mm.

Liver:  Limited evaluation.  Findings suggestive of fatty
infiltration.

IVC:  Negative.

Pancreas:  Limited evaluation secondary to bowel gas.

Spleen:  Negative.

Right Kidney:  10.7 cm.  No hydronephrosis.  Limited evaluation
without obvious mass.

Left Kidney:  11.3 cm.  No hydronephrosis.  Limited evaluation
without obvious renal mass.

Abdominal aorta:  Atherosclerotic type changes.  Limit evaluation.
Portions visualized without aneurysmal dilation.
IMPRESSION: Evaluation limited by excessive bowel gas.  Findings suggestive of
gallbladder wall thickening.  This may be related to cholecystitis
although other causes such as that secondary to hepatitis,
metabolic abnormality or increased right heart pressure also
considerations.  Please see above.

## 2012-05-08 IMAGING — CR DG CHEST 2V
2 series · 2 of 2 positions shown · non-contrast
Comparison: 09/24/2010 and earlier.

CLINICAL DATA: 78-year-old female with nausea, vomiting, abdominal
pain.

CHEST - 2 VIEW

[w chest lat]
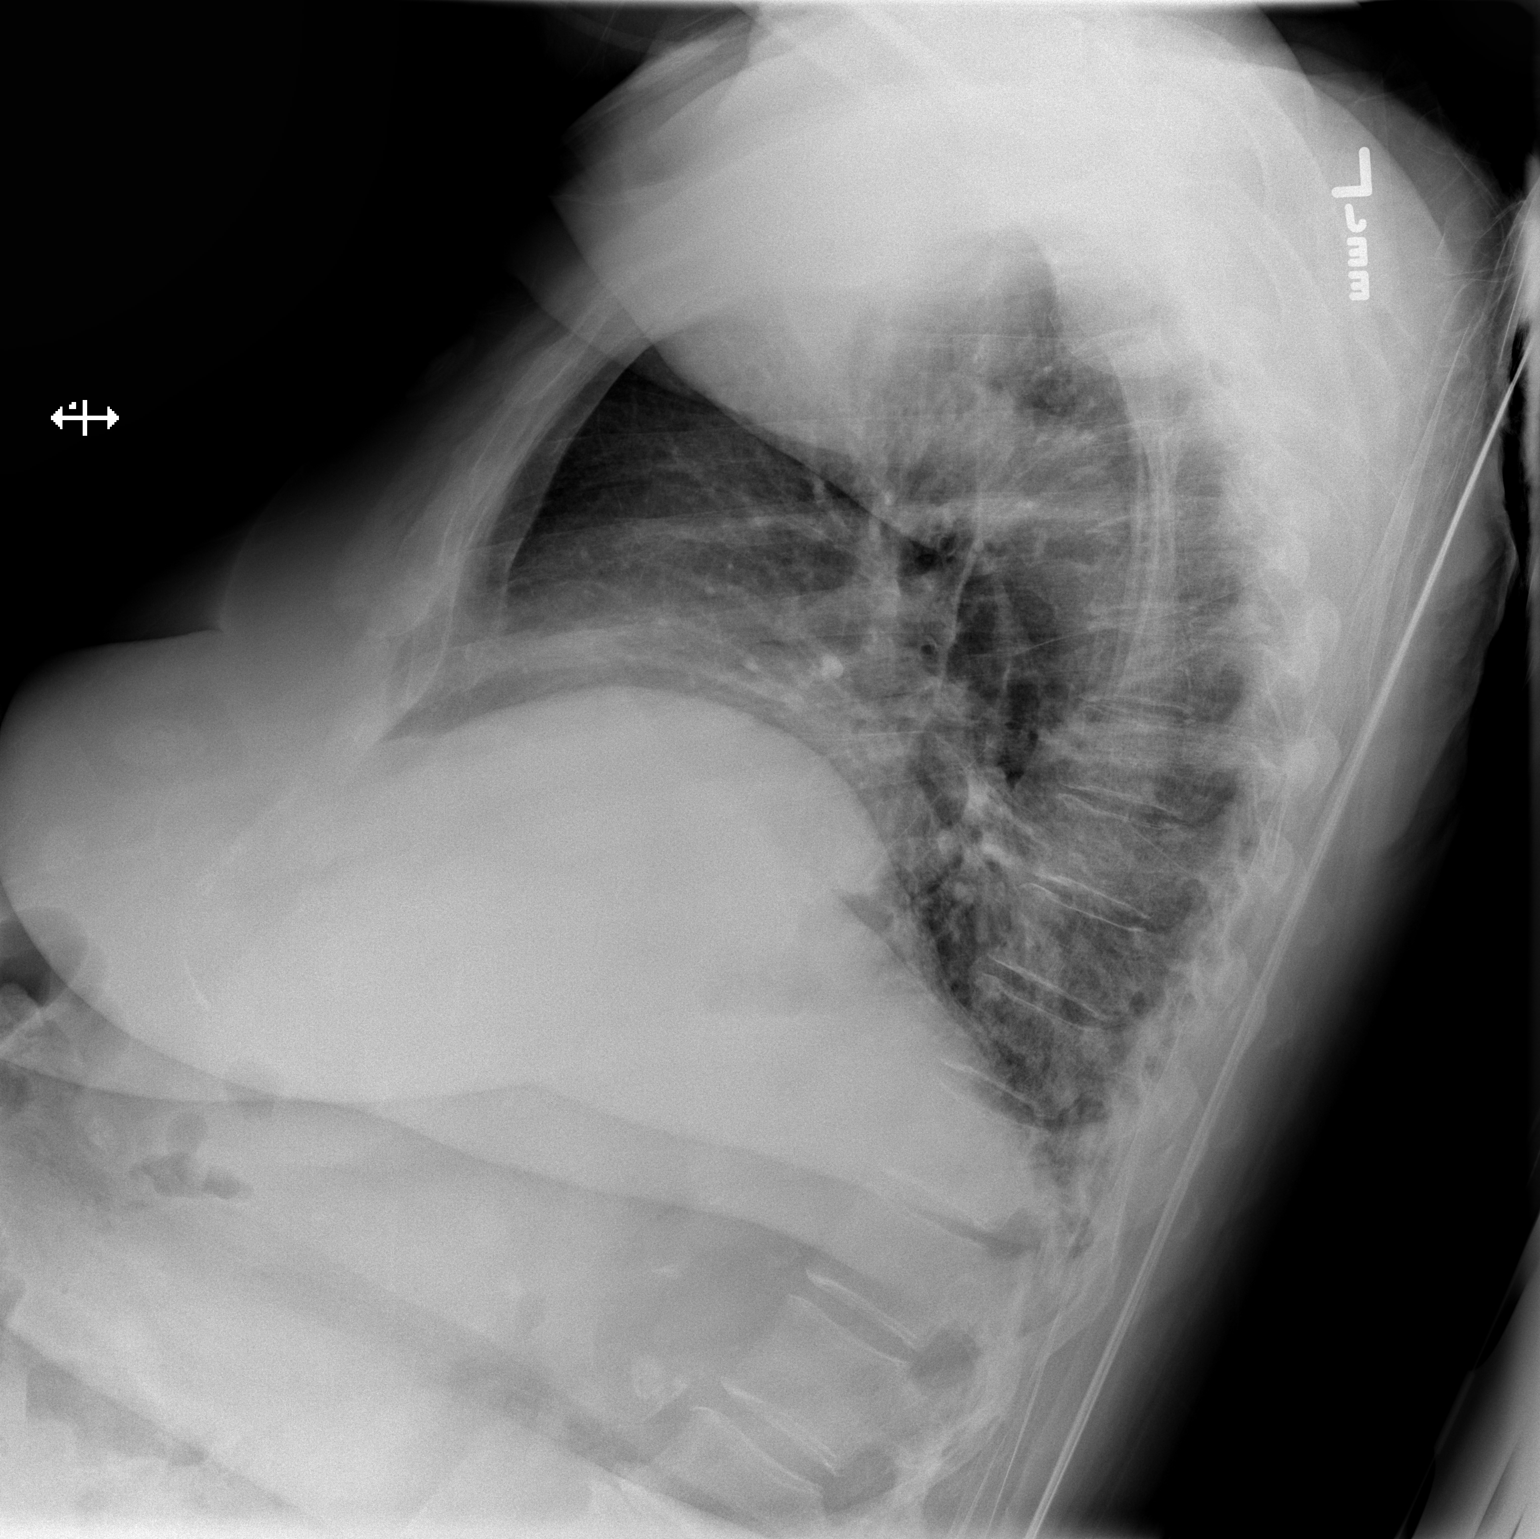

[x chest ap]
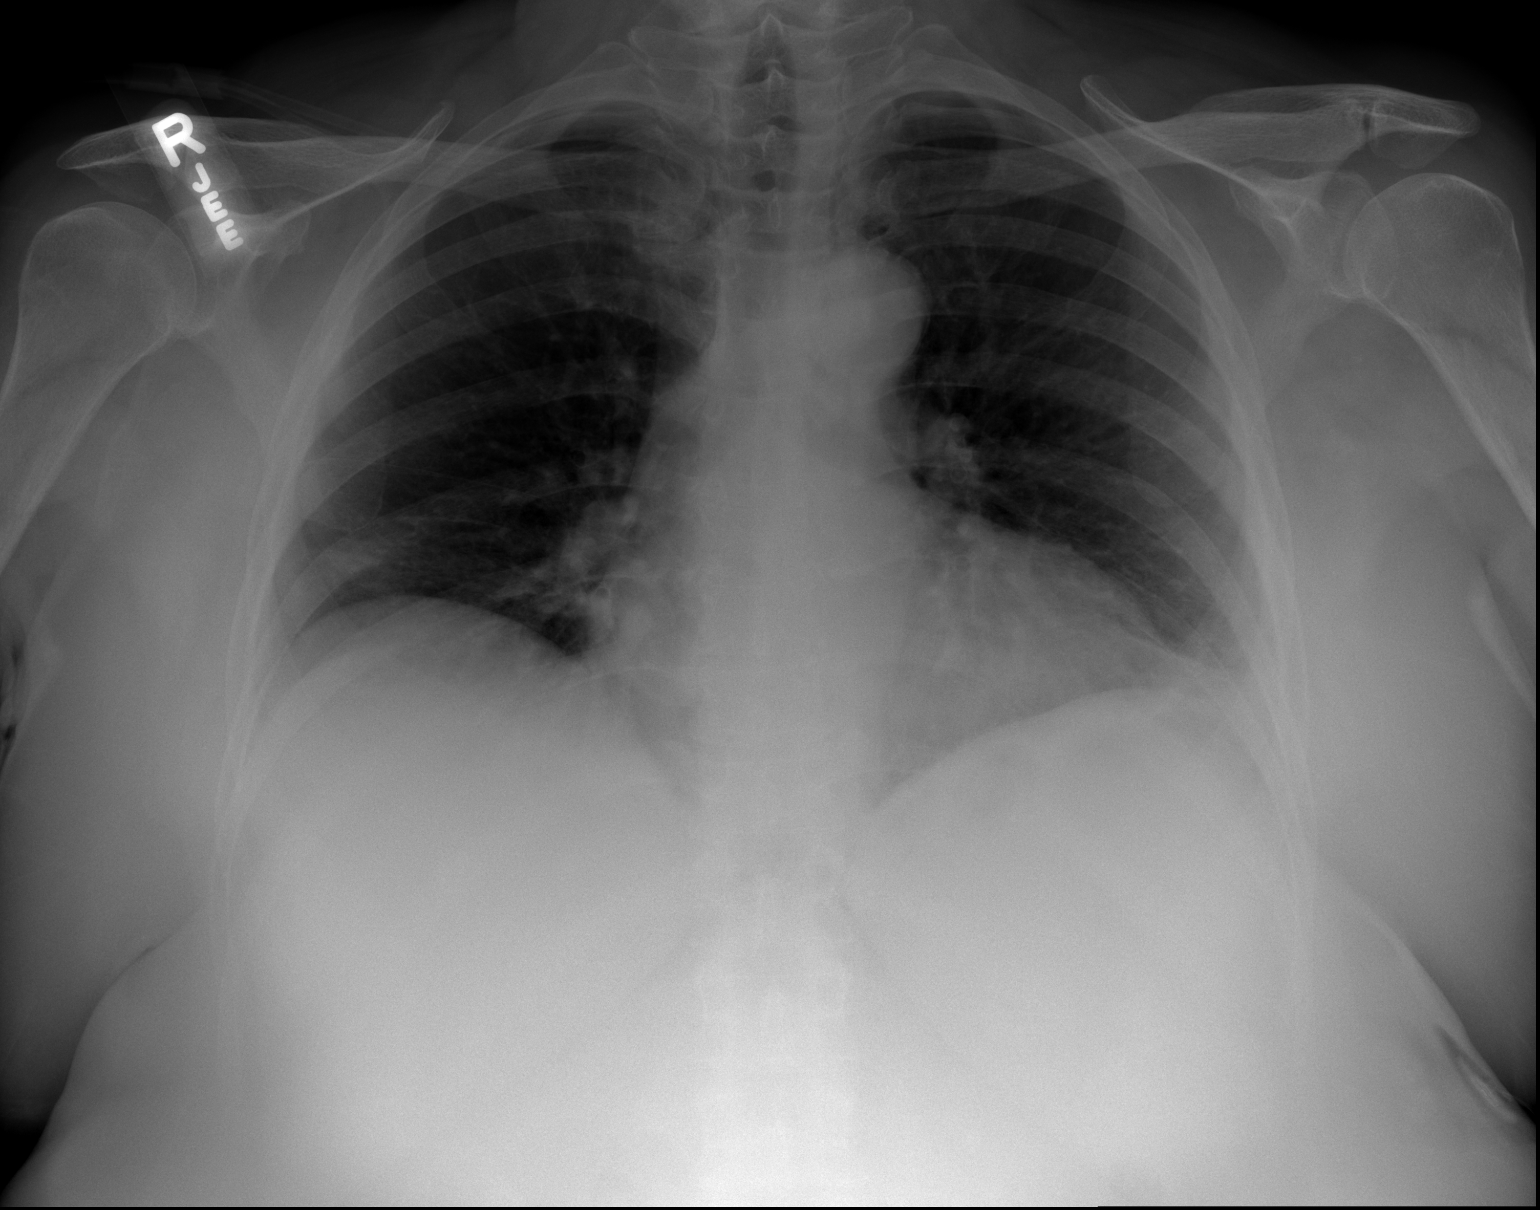

[2 of 2 positions shown; findings below may reference images not displayed]

FINDINGS: Upright AP and lateral views of the chest.  Low lung
volumes with bibasilar atelectasis.  Cardiac size is stable at the
upper limits of normal. Other mediastinal contours are within
normal limits.  No pneumothorax, pulmonary edema, pleural effusion
or consolidation. No acute osseous abnormality identified.
IMPRESSION: Low lung volumes with atelectasis.

## 2012-05-09 IMAGING — CT CT ABD-PELV W/O CM
2 of 4 series · 17 of 46 positions shown, 19 images · non-contrast
Comparison: Ultrasound abdomen dated 04/29/2011

CLINICAL DATA: Epigastric abdominal pain since last night,
nausea/vomiting, evaluate for cholecystitis or gastritis.  History
of appendectomy and hysterectomy.

CT ABDOMEN AND PELVIS WITHOUT CONTRAST
TECHNIQUE: Multidetector CT imaging of the abdomen and pelvis was
performed following the standard protocol without intravenous
contrast.

[Series 2: abd/pelv w/o 5.0 b31f st · axial · non-contrast · 0.85mm/px · z∈[+862,+1282]mm · 14 of 93 slices shown, 16 images]
[im 5/93  soft-tissue]
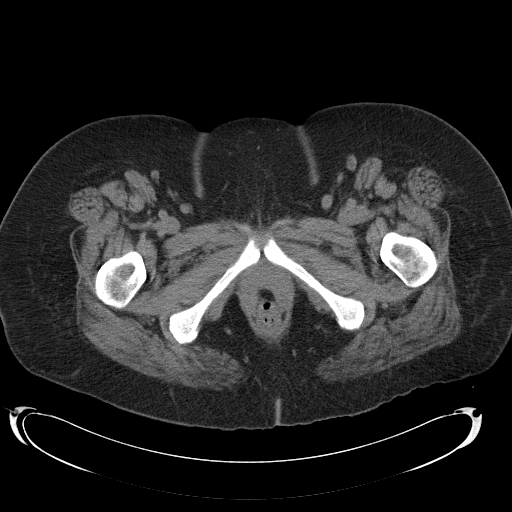
[im 5/93  bone]
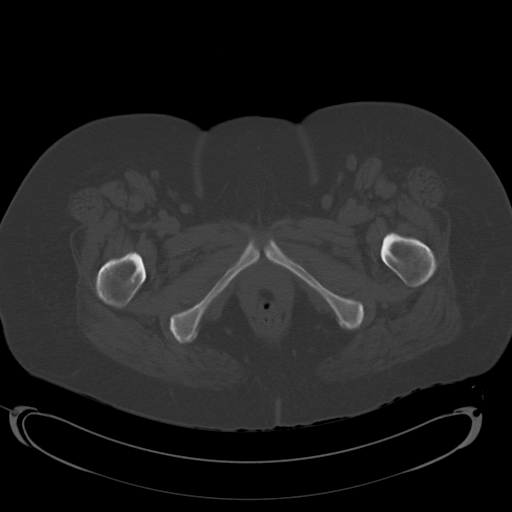
[im 13/93  soft-tissue]
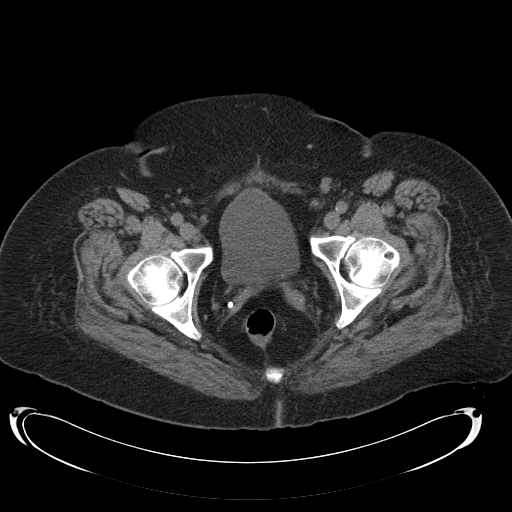
[im 17/93  soft-tissue]
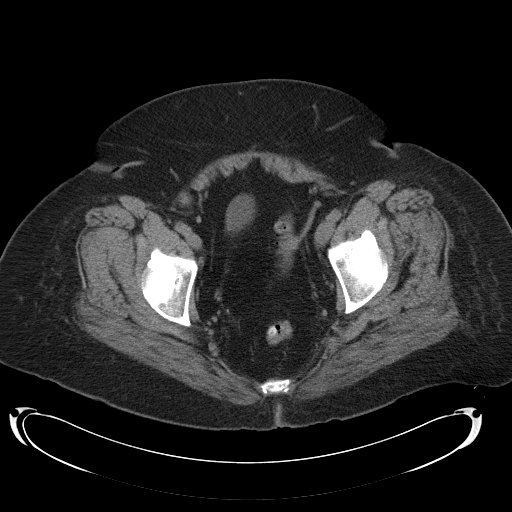
[im 25/93  soft-tissue]
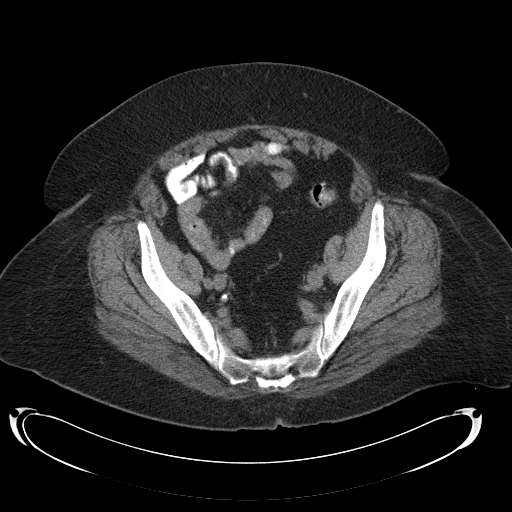
[im 33/93  soft-tissue]
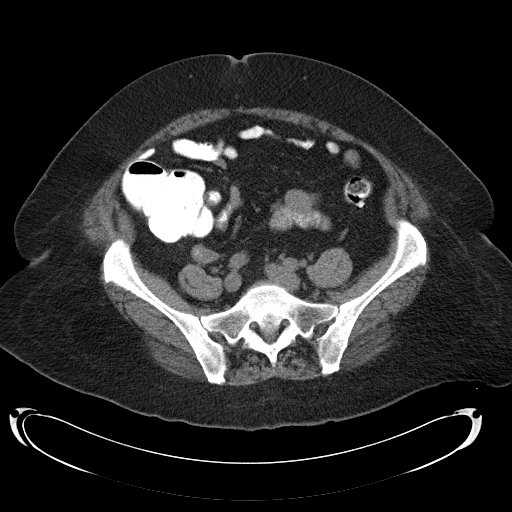
[im 37/93  soft-tissue]
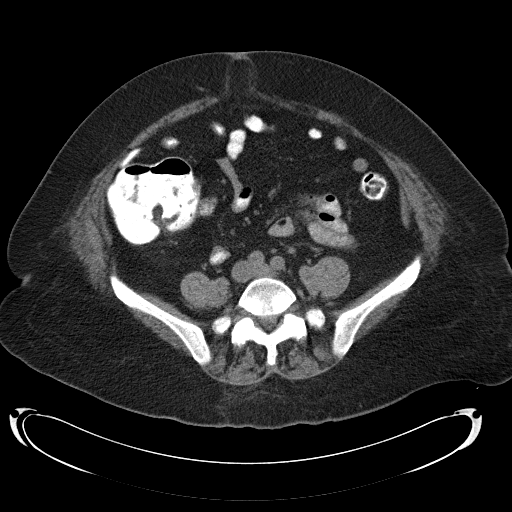
[im 45/93  soft-tissue]
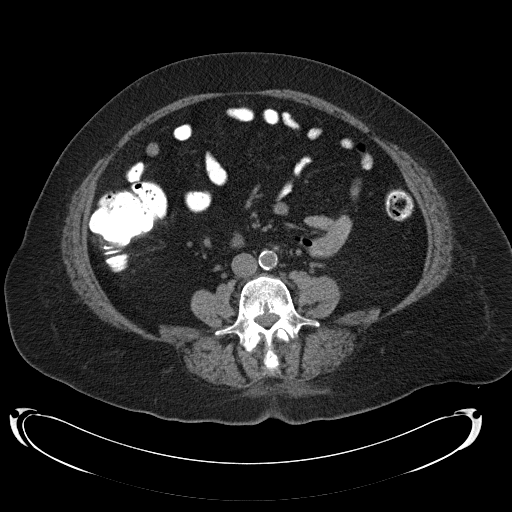
[im 49/93  soft-tissue]
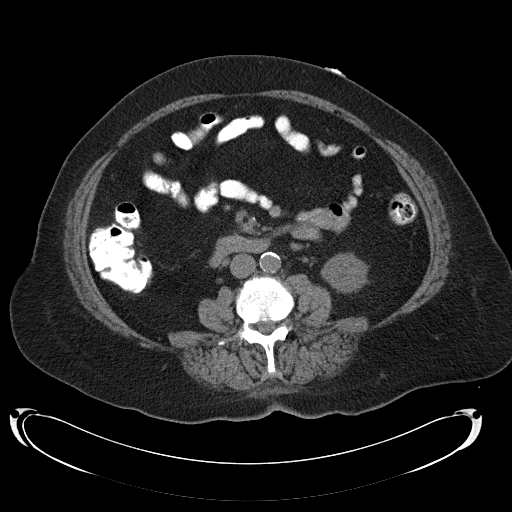
[im 57/93  soft-tissue]
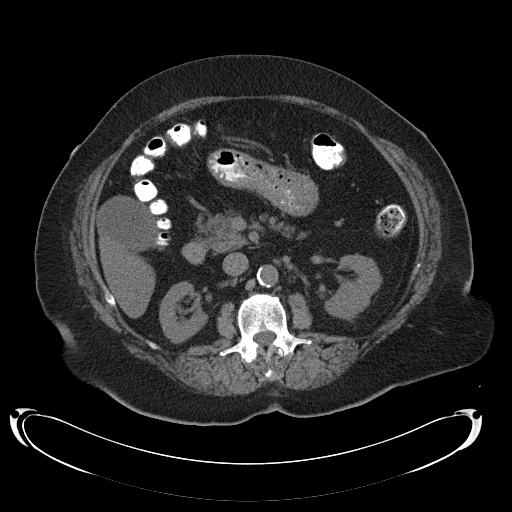
[im 57/93  bone]
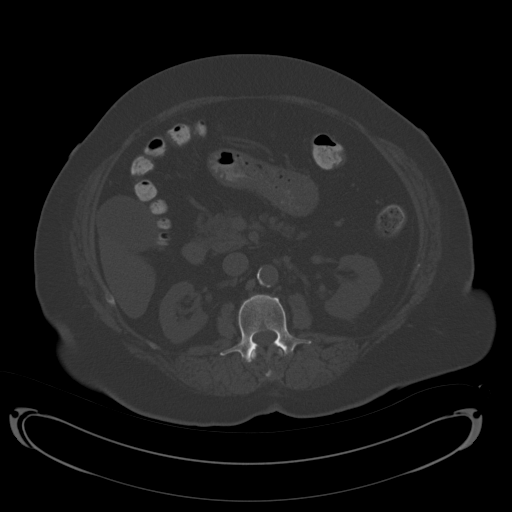
[im 61/93  soft-tissue]
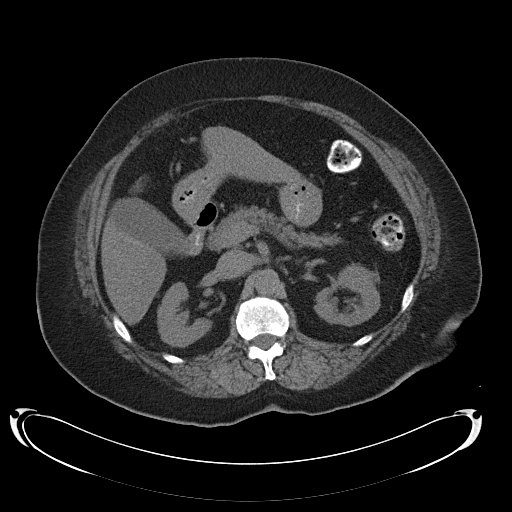
[im 69/93  soft-tissue]
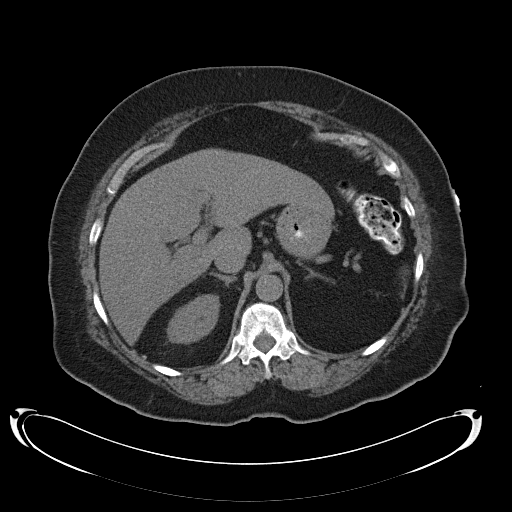
[im 77/93  soft-tissue]
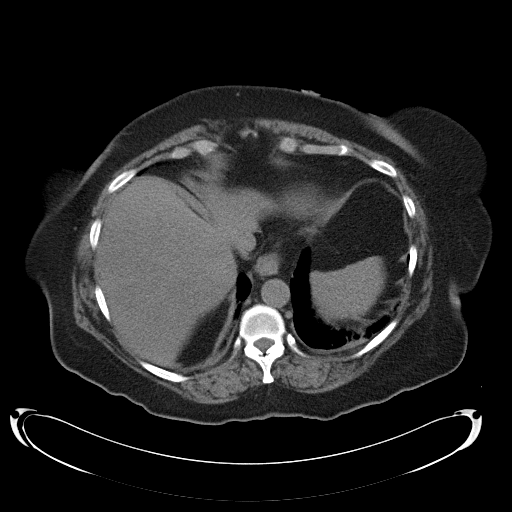
[im 81/93  soft-tissue]
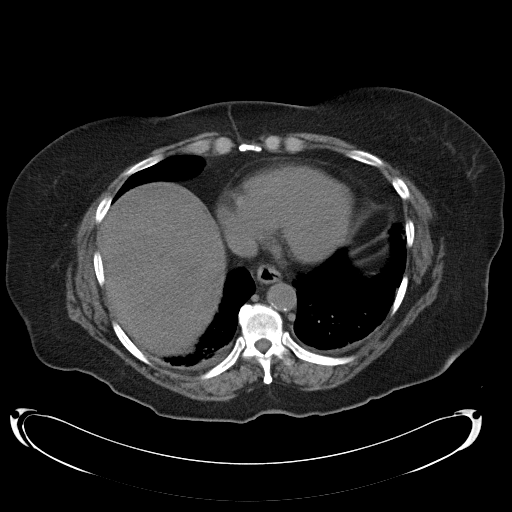
[im 89/93  soft-tissue]
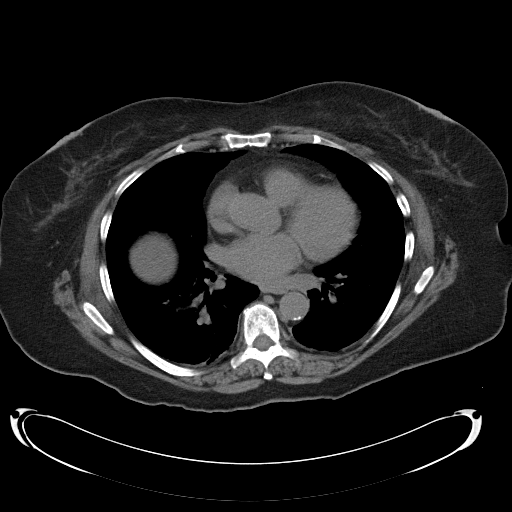

[Series 5: abd/pelv w/o 2.0 spo cor thins · coronal · non-contrast · 0.89mm/px · 3 of 146 slices shown]
[im 49/146  soft-tissue]
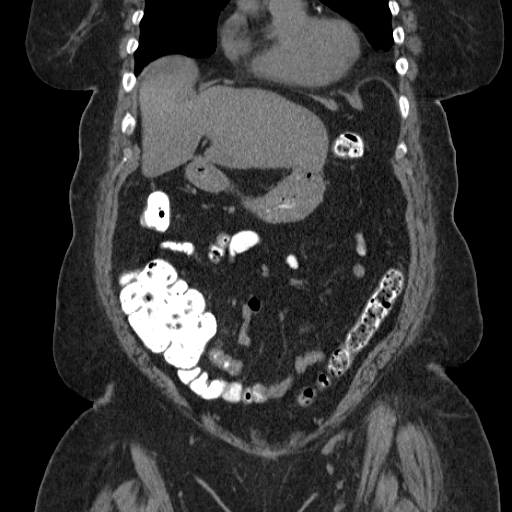
[im 65/146  soft-tissue]
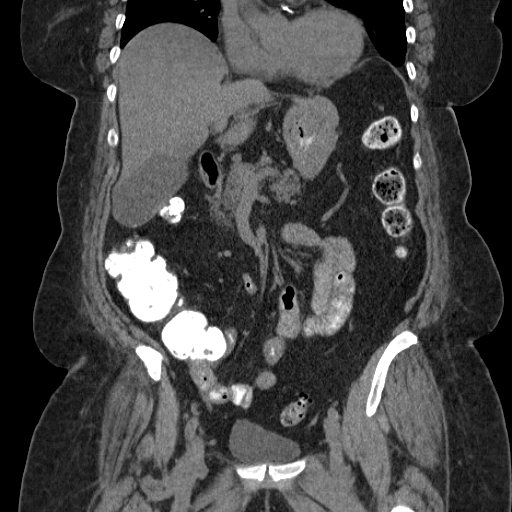
[im 81/146  soft-tissue]
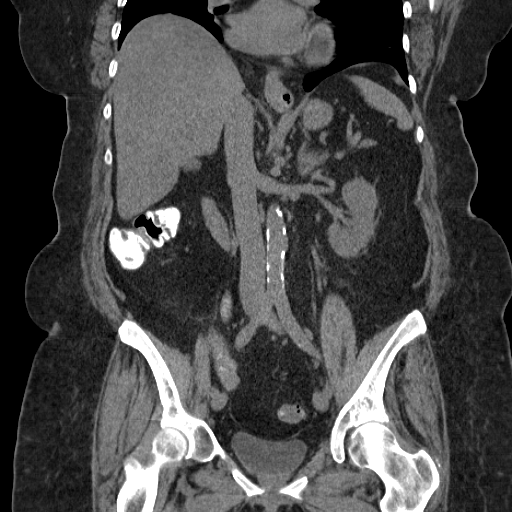

[17 of 46 positions shown; findings below may reference images not displayed]

FINDINGS: Multiple small thyroid nodules of the lateral right lung
base, approximately 7-8 in number, the largest measuring 4 mm
(series 3/image 5).  Mild linear scarring/atelectasis in the
bilateral lower lobes.  Trace right pleural effusion.

Small hiatal hernia.

Unenhanced liver, spleen, pancreas, and adrenal glands are within
normal limits.

Gallbladder is mildly distended but otherwise unremarkable, without
associated inflammatory changes.

Kidneys are unremarkable, without renal calculi or hydronephrosis.

No evidence of bowel obstruction. Appendix is reportedly surgically
absent.  The colonic diverticulosis, without associated
inflammatory changes.

No abdominopelvic ascites.  No suspicious abdominopelvic
lymphadenopathy.

Atherosclerotic calcifications of the abdominal aorta and branch
vessels.

Status post hysterectomy.  No adnexal masses.

No ureteral or bladder calculi.  The bladder is within normal
limits.

Degenerative changes of the visualized thoracolumbar spine.
IMPRESSION: Gallbladder is mildly distended but without associated inflammatory
changes.

No evidence of bowel obstruction.  Status post appendectomy.
Colonic diverticulosis, without associated inflammatory changes.

Small hiatal hernia.

No renal, ureteral, or bladder calculi.

## 2012-05-26 DIAGNOSIS — H43399 Other vitreous opacities, unspecified eye: Secondary | ICD-10-CM | POA: Diagnosis not present

## 2012-05-26 DIAGNOSIS — Z961 Presence of intraocular lens: Secondary | ICD-10-CM | POA: Diagnosis not present

## 2012-06-09 DIAGNOSIS — H43819 Vitreous degeneration, unspecified eye: Secondary | ICD-10-CM | POA: Diagnosis not present

## 2012-06-12 DIAGNOSIS — B379 Candidiasis, unspecified: Secondary | ICD-10-CM | POA: Diagnosis not present

## 2012-06-16 DIAGNOSIS — B372 Candidiasis of skin and nail: Secondary | ICD-10-CM | POA: Diagnosis not present

## 2012-06-17 DIAGNOSIS — Z23 Encounter for immunization: Secondary | ICD-10-CM | POA: Diagnosis not present

## 2012-07-04 DIAGNOSIS — E039 Hypothyroidism, unspecified: Secondary | ICD-10-CM | POA: Diagnosis not present

## 2012-07-04 DIAGNOSIS — R21 Rash and other nonspecific skin eruption: Secondary | ICD-10-CM | POA: Diagnosis not present

## 2012-07-04 DIAGNOSIS — I10 Essential (primary) hypertension: Secondary | ICD-10-CM | POA: Diagnosis not present

## 2012-07-04 DIAGNOSIS — E785 Hyperlipidemia, unspecified: Secondary | ICD-10-CM | POA: Diagnosis not present

## 2012-07-04 DIAGNOSIS — Z79899 Other long term (current) drug therapy: Secondary | ICD-10-CM | POA: Diagnosis not present

## 2012-08-17 DIAGNOSIS — S8000XA Contusion of unspecified knee, initial encounter: Secondary | ICD-10-CM | POA: Diagnosis not present

## 2012-08-23 DIAGNOSIS — H52229 Regular astigmatism, unspecified eye: Secondary | ICD-10-CM | POA: Diagnosis not present

## 2012-08-23 DIAGNOSIS — R233 Spontaneous ecchymoses: Secondary | ICD-10-CM | POA: Diagnosis not present

## 2012-08-23 DIAGNOSIS — H524 Presbyopia: Secondary | ICD-10-CM | POA: Diagnosis not present

## 2012-08-23 DIAGNOSIS — H5200 Hypermetropia, unspecified eye: Secondary | ICD-10-CM | POA: Diagnosis not present

## 2012-08-29 DIAGNOSIS — M549 Dorsalgia, unspecified: Secondary | ICD-10-CM | POA: Diagnosis not present

## 2012-09-29 DIAGNOSIS — R51 Headache: Secondary | ICD-10-CM | POA: Diagnosis not present

## 2012-09-29 DIAGNOSIS — G501 Atypical facial pain: Secondary | ICD-10-CM | POA: Diagnosis not present

## 2012-09-29 DIAGNOSIS — M545 Low back pain: Secondary | ICD-10-CM | POA: Diagnosis not present

## 2012-09-30 DIAGNOSIS — S8000XA Contusion of unspecified knee, initial encounter: Secondary | ICD-10-CM | POA: Diagnosis not present

## 2012-10-13 DIAGNOSIS — G603 Idiopathic progressive neuropathy: Secondary | ICD-10-CM | POA: Diagnosis not present

## 2012-10-13 DIAGNOSIS — J019 Acute sinusitis, unspecified: Secondary | ICD-10-CM | POA: Diagnosis not present

## 2012-10-13 DIAGNOSIS — Z79899 Other long term (current) drug therapy: Secondary | ICD-10-CM | POA: Diagnosis not present

## 2012-10-13 DIAGNOSIS — J209 Acute bronchitis, unspecified: Secondary | ICD-10-CM | POA: Diagnosis not present

## 2012-10-13 DIAGNOSIS — E039 Hypothyroidism, unspecified: Secondary | ICD-10-CM | POA: Diagnosis not present

## 2012-10-13 DIAGNOSIS — I10 Essential (primary) hypertension: Secondary | ICD-10-CM | POA: Diagnosis not present

## 2012-10-13 DIAGNOSIS — E785 Hyperlipidemia, unspecified: Secondary | ICD-10-CM | POA: Diagnosis not present

## 2012-10-21 DIAGNOSIS — G501 Atypical facial pain: Secondary | ICD-10-CM | POA: Diagnosis not present

## 2012-10-21 DIAGNOSIS — R51 Headache: Secondary | ICD-10-CM | POA: Diagnosis not present

## 2012-10-28 DIAGNOSIS — G459 Transient cerebral ischemic attack, unspecified: Secondary | ICD-10-CM | POA: Diagnosis not present

## 2012-10-28 DIAGNOSIS — G5790 Unspecified mononeuropathy of unspecified lower limb: Secondary | ICD-10-CM | POA: Diagnosis not present

## 2012-10-28 DIAGNOSIS — G2581 Restless legs syndrome: Secondary | ICD-10-CM | POA: Diagnosis not present

## 2012-10-28 DIAGNOSIS — G603 Idiopathic progressive neuropathy: Secondary | ICD-10-CM | POA: Diagnosis not present

## 2012-11-03 DIAGNOSIS — R05 Cough: Secondary | ICD-10-CM | POA: Diagnosis not present

## 2012-11-03 DIAGNOSIS — J209 Acute bronchitis, unspecified: Secondary | ICD-10-CM | POA: Diagnosis not present

## 2012-12-01 DIAGNOSIS — M5137 Other intervertebral disc degeneration, lumbosacral region: Secondary | ICD-10-CM | POA: Diagnosis not present

## 2012-12-05 DIAGNOSIS — M79609 Pain in unspecified limb: Secondary | ICD-10-CM | POA: Diagnosis not present

## 2012-12-05 DIAGNOSIS — M25559 Pain in unspecified hip: Secondary | ICD-10-CM | POA: Diagnosis not present

## 2012-12-05 DIAGNOSIS — R609 Edema, unspecified: Secondary | ICD-10-CM | POA: Diagnosis not present

## 2012-12-05 DIAGNOSIS — M25569 Pain in unspecified knee: Secondary | ICD-10-CM | POA: Diagnosis not present

## 2012-12-06 DIAGNOSIS — M25559 Pain in unspecified hip: Secondary | ICD-10-CM | POA: Diagnosis not present

## 2012-12-06 DIAGNOSIS — G603 Idiopathic progressive neuropathy: Secondary | ICD-10-CM | POA: Diagnosis not present

## 2012-12-06 DIAGNOSIS — G2581 Restless legs syndrome: Secondary | ICD-10-CM | POA: Diagnosis not present

## 2012-12-06 DIAGNOSIS — G459 Transient cerebral ischemic attack, unspecified: Secondary | ICD-10-CM | POA: Diagnosis not present

## 2012-12-06 DIAGNOSIS — G5790 Unspecified mononeuropathy of unspecified lower limb: Secondary | ICD-10-CM | POA: Diagnosis not present

## 2012-12-06 DIAGNOSIS — M25569 Pain in unspecified knee: Secondary | ICD-10-CM | POA: Diagnosis not present

## 2012-12-19 DIAGNOSIS — Z79899 Other long term (current) drug therapy: Secondary | ICD-10-CM | POA: Diagnosis not present

## 2012-12-19 DIAGNOSIS — R0602 Shortness of breath: Secondary | ICD-10-CM | POA: Diagnosis not present

## 2012-12-19 DIAGNOSIS — F411 Generalized anxiety disorder: Secondary | ICD-10-CM | POA: Diagnosis not present

## 2012-12-19 DIAGNOSIS — R609 Edema, unspecified: Secondary | ICD-10-CM | POA: Diagnosis not present

## 2012-12-19 DIAGNOSIS — I10 Essential (primary) hypertension: Secondary | ICD-10-CM | POA: Diagnosis not present

## 2012-12-20 DIAGNOSIS — I10 Essential (primary) hypertension: Secondary | ICD-10-CM | POA: Diagnosis not present

## 2012-12-20 DIAGNOSIS — R0602 Shortness of breath: Secondary | ICD-10-CM | POA: Diagnosis not present

## 2012-12-20 DIAGNOSIS — Z79899 Other long term (current) drug therapy: Secondary | ICD-10-CM | POA: Diagnosis not present

## 2012-12-22 DIAGNOSIS — R911 Solitary pulmonary nodule: Secondary | ICD-10-CM | POA: Diagnosis not present

## 2012-12-22 DIAGNOSIS — R0602 Shortness of breath: Secondary | ICD-10-CM | POA: Diagnosis not present

## 2013-01-04 DIAGNOSIS — R0602 Shortness of breath: Secondary | ICD-10-CM | POA: Diagnosis not present

## 2013-01-04 DIAGNOSIS — I2510 Atherosclerotic heart disease of native coronary artery without angina pectoris: Secondary | ICD-10-CM | POA: Diagnosis not present

## 2013-01-04 DIAGNOSIS — I7090 Unspecified atherosclerosis: Secondary | ICD-10-CM | POA: Diagnosis not present

## 2013-01-04 DIAGNOSIS — I10 Essential (primary) hypertension: Secondary | ICD-10-CM | POA: Diagnosis not present

## 2013-01-11 DIAGNOSIS — R0602 Shortness of breath: Secondary | ICD-10-CM | POA: Diagnosis not present

## 2013-01-11 DIAGNOSIS — R0789 Other chest pain: Secondary | ICD-10-CM | POA: Diagnosis not present

## 2013-01-11 DIAGNOSIS — I2510 Atherosclerotic heart disease of native coronary artery without angina pectoris: Secondary | ICD-10-CM | POA: Diagnosis not present

## 2013-01-11 DIAGNOSIS — I7090 Unspecified atherosclerosis: Secondary | ICD-10-CM | POA: Diagnosis not present

## 2013-01-19 DIAGNOSIS — J309 Allergic rhinitis, unspecified: Secondary | ICD-10-CM | POA: Diagnosis not present

## 2013-01-19 DIAGNOSIS — I2510 Atherosclerotic heart disease of native coronary artery without angina pectoris: Secondary | ICD-10-CM | POA: Diagnosis not present

## 2013-01-19 DIAGNOSIS — I10 Essential (primary) hypertension: Secondary | ICD-10-CM | POA: Diagnosis not present

## 2013-01-19 DIAGNOSIS — F411 Generalized anxiety disorder: Secondary | ICD-10-CM | POA: Diagnosis not present

## 2013-01-19 DIAGNOSIS — R0602 Shortness of breath: Secondary | ICD-10-CM | POA: Diagnosis not present

## 2013-01-23 ENCOUNTER — Other Ambulatory Visit: Payer: Self-pay

## 2013-01-23 DIAGNOSIS — Z1231 Encounter for screening mammogram for malignant neoplasm of breast: Secondary | ICD-10-CM

## 2013-01-30 ENCOUNTER — Ambulatory Visit
Admission: RE | Admit: 2013-01-30 | Discharge: 2013-01-30 | Disposition: A | Discharge status: Home / Self Care | Payer: Medicare Other | Source: Ambulatory Visit

## 2013-01-30 DIAGNOSIS — Z1231 Encounter for screening mammogram for malignant neoplasm of breast: Secondary | ICD-10-CM | POA: Diagnosis not present

## 2013-02-13 DIAGNOSIS — E039 Hypothyroidism, unspecified: Secondary | ICD-10-CM | POA: Diagnosis not present

## 2013-02-13 DIAGNOSIS — K8010 Calculus of gallbladder with chronic cholecystitis without obstruction: Secondary | ICD-10-CM | POA: Diagnosis not present

## 2013-02-13 DIAGNOSIS — R0602 Shortness of breath: Secondary | ICD-10-CM | POA: Diagnosis not present

## 2013-02-13 DIAGNOSIS — I7090 Unspecified atherosclerosis: Secondary | ICD-10-CM | POA: Diagnosis not present

## 2013-02-13 DIAGNOSIS — I2510 Atherosclerotic heart disease of native coronary artery without angina pectoris: Secondary | ICD-10-CM | POA: Diagnosis not present

## 2013-02-13 DIAGNOSIS — I471 Supraventricular tachycardia: Secondary | ICD-10-CM | POA: Diagnosis not present

## 2013-02-13 DIAGNOSIS — I10 Essential (primary) hypertension: Secondary | ICD-10-CM | POA: Diagnosis not present

## 2013-02-14 DIAGNOSIS — R609 Edema, unspecified: Secondary | ICD-10-CM | POA: Diagnosis not present

## 2013-02-14 DIAGNOSIS — M549 Dorsalgia, unspecified: Secondary | ICD-10-CM | POA: Diagnosis not present

## 2013-02-14 DIAGNOSIS — I10 Essential (primary) hypertension: Secondary | ICD-10-CM | POA: Diagnosis not present

## 2013-02-14 DIAGNOSIS — E039 Hypothyroidism, unspecified: Secondary | ICD-10-CM | POA: Diagnosis not present

## 2013-02-14 DIAGNOSIS — R7989 Other specified abnormal findings of blood chemistry: Secondary | ICD-10-CM | POA: Diagnosis not present

## 2013-02-14 DIAGNOSIS — E785 Hyperlipidemia, unspecified: Secondary | ICD-10-CM | POA: Diagnosis not present

## 2013-02-14 DIAGNOSIS — Z79899 Other long term (current) drug therapy: Secondary | ICD-10-CM | POA: Diagnosis not present

## 2013-02-14 DIAGNOSIS — G4700 Insomnia, unspecified: Secondary | ICD-10-CM | POA: Diagnosis not present

## 2013-03-02 DIAGNOSIS — R0602 Shortness of breath: Secondary | ICD-10-CM | POA: Diagnosis not present

## 2013-03-09 DIAGNOSIS — I2510 Atherosclerotic heart disease of native coronary artery without angina pectoris: Secondary | ICD-10-CM | POA: Diagnosis not present

## 2013-03-20 DIAGNOSIS — H43399 Other vitreous opacities, unspecified eye: Secondary | ICD-10-CM | POA: Diagnosis not present

## 2013-03-20 DIAGNOSIS — R233 Spontaneous ecchymoses: Secondary | ICD-10-CM | POA: Diagnosis not present

## 2013-03-29 DIAGNOSIS — I10 Essential (primary) hypertension: Secondary | ICD-10-CM | POA: Diagnosis not present

## 2013-03-29 DIAGNOSIS — R0602 Shortness of breath: Secondary | ICD-10-CM | POA: Diagnosis not present

## 2013-03-29 DIAGNOSIS — R002 Palpitations: Secondary | ICD-10-CM | POA: Diagnosis not present

## 2013-03-29 DIAGNOSIS — I2510 Atherosclerotic heart disease of native coronary artery without angina pectoris: Secondary | ICD-10-CM | POA: Diagnosis not present

## 2013-03-29 DIAGNOSIS — I471 Supraventricular tachycardia: Secondary | ICD-10-CM | POA: Diagnosis not present

## 2013-04-13 DIAGNOSIS — R0602 Shortness of breath: Secondary | ICD-10-CM | POA: Diagnosis not present

## 2013-04-13 DIAGNOSIS — R05 Cough: Secondary | ICD-10-CM | POA: Diagnosis not present

## 2013-04-13 DIAGNOSIS — K219 Gastro-esophageal reflux disease without esophagitis: Secondary | ICD-10-CM | POA: Diagnosis not present

## 2013-04-15 DIAGNOSIS — A46 Erysipelas: Secondary | ICD-10-CM | POA: Diagnosis not present

## 2013-04-18 DIAGNOSIS — L02419 Cutaneous abscess of limb, unspecified: Secondary | ICD-10-CM | POA: Diagnosis not present

## 2013-04-18 DIAGNOSIS — L03119 Cellulitis of unspecified part of limb: Secondary | ICD-10-CM | POA: Diagnosis not present

## 2013-04-18 DIAGNOSIS — E0590 Thyrotoxicosis, unspecified without thyrotoxic crisis or storm: Secondary | ICD-10-CM | POA: Diagnosis not present

## 2013-04-18 DIAGNOSIS — I10 Essential (primary) hypertension: Secondary | ICD-10-CM | POA: Diagnosis not present

## 2013-05-31 DIAGNOSIS — E785 Hyperlipidemia, unspecified: Secondary | ICD-10-CM | POA: Diagnosis not present

## 2013-05-31 DIAGNOSIS — Z79899 Other long term (current) drug therapy: Secondary | ICD-10-CM | POA: Diagnosis not present

## 2013-05-31 DIAGNOSIS — M47812 Spondylosis without myelopathy or radiculopathy, cervical region: Secondary | ICD-10-CM | POA: Diagnosis not present

## 2013-05-31 DIAGNOSIS — M129 Arthropathy, unspecified: Secondary | ICD-10-CM | POA: Diagnosis not present

## 2013-05-31 DIAGNOSIS — M542 Cervicalgia: Secondary | ICD-10-CM | POA: Diagnosis not present

## 2013-05-31 DIAGNOSIS — I10 Essential (primary) hypertension: Secondary | ICD-10-CM | POA: Diagnosis not present

## 2013-06-13 DIAGNOSIS — H02839 Dermatochalasis of unspecified eye, unspecified eyelid: Secondary | ICD-10-CM | POA: Diagnosis not present

## 2013-06-13 DIAGNOSIS — Z961 Presence of intraocular lens: Secondary | ICD-10-CM | POA: Diagnosis not present

## 2013-06-13 DIAGNOSIS — H04129 Dry eye syndrome of unspecified lacrimal gland: Secondary | ICD-10-CM | POA: Diagnosis not present

## 2013-06-19 DIAGNOSIS — I10 Essential (primary) hypertension: Secondary | ICD-10-CM | POA: Diagnosis not present

## 2013-06-19 DIAGNOSIS — R05 Cough: Secondary | ICD-10-CM | POA: Diagnosis not present

## 2013-06-19 DIAGNOSIS — M542 Cervicalgia: Secondary | ICD-10-CM | POA: Diagnosis not present

## 2013-07-20 DIAGNOSIS — M7150 Other bursitis, not elsewhere classified, unspecified site: Secondary | ICD-10-CM | POA: Diagnosis not present

## 2013-07-31 DIAGNOSIS — I10 Essential (primary) hypertension: Secondary | ICD-10-CM | POA: Diagnosis not present

## 2013-07-31 DIAGNOSIS — R233 Spontaneous ecchymoses: Secondary | ICD-10-CM | POA: Diagnosis not present

## 2013-07-31 DIAGNOSIS — F411 Generalized anxiety disorder: Secondary | ICD-10-CM | POA: Diagnosis not present

## 2013-07-31 DIAGNOSIS — E039 Hypothyroidism, unspecified: Secondary | ICD-10-CM | POA: Diagnosis not present

## 2013-08-08 DIAGNOSIS — I471 Supraventricular tachycardia: Secondary | ICD-10-CM | POA: Diagnosis not present

## 2013-08-08 DIAGNOSIS — I2510 Atherosclerotic heart disease of native coronary artery without angina pectoris: Secondary | ICD-10-CM | POA: Diagnosis not present

## 2013-08-08 DIAGNOSIS — R002 Palpitations: Secondary | ICD-10-CM | POA: Diagnosis not present

## 2013-08-08 DIAGNOSIS — I7090 Unspecified atherosclerosis: Secondary | ICD-10-CM | POA: Diagnosis not present

## 2013-08-08 DIAGNOSIS — R0602 Shortness of breath: Secondary | ICD-10-CM | POA: Diagnosis not present

## 2013-08-24 DIAGNOSIS — G2581 Restless legs syndrome: Secondary | ICD-10-CM | POA: Diagnosis not present

## 2013-08-24 DIAGNOSIS — R197 Diarrhea, unspecified: Secondary | ICD-10-CM | POA: Diagnosis not present

## 2013-08-24 DIAGNOSIS — F411 Generalized anxiety disorder: Secondary | ICD-10-CM | POA: Diagnosis not present

## 2013-08-24 DIAGNOSIS — M25519 Pain in unspecified shoulder: Secondary | ICD-10-CM | POA: Diagnosis not present

## 2013-08-24 DIAGNOSIS — R209 Unspecified disturbances of skin sensation: Secondary | ICD-10-CM | POA: Diagnosis not present

## 2013-08-28 DIAGNOSIS — Z961 Presence of intraocular lens: Secondary | ICD-10-CM | POA: Diagnosis not present

## 2013-08-28 DIAGNOSIS — Z9849 Cataract extraction status, unspecified eye: Secondary | ICD-10-CM | POA: Diagnosis not present

## 2013-08-28 DIAGNOSIS — R233 Spontaneous ecchymoses: Secondary | ICD-10-CM | POA: Diagnosis not present

## 2013-08-28 DIAGNOSIS — H43819 Vitreous degeneration, unspecified eye: Secondary | ICD-10-CM | POA: Diagnosis not present

## 2013-09-05 DIAGNOSIS — S51809A Unspecified open wound of unspecified forearm, initial encounter: Secondary | ICD-10-CM | POA: Diagnosis not present

## 2013-09-13 DIAGNOSIS — Z23 Encounter for immunization: Secondary | ICD-10-CM | POA: Diagnosis not present

## 2013-09-21 DIAGNOSIS — I10 Essential (primary) hypertension: Secondary | ICD-10-CM | POA: Diagnosis not present

## 2013-09-21 DIAGNOSIS — E785 Hyperlipidemia, unspecified: Secondary | ICD-10-CM | POA: Diagnosis not present

## 2013-09-21 DIAGNOSIS — E119 Type 2 diabetes mellitus without complications: Secondary | ICD-10-CM | POA: Diagnosis not present

## 2013-09-21 DIAGNOSIS — Z79899 Other long term (current) drug therapy: Secondary | ICD-10-CM | POA: Diagnosis not present

## 2013-10-11 DIAGNOSIS — M25519 Pain in unspecified shoulder: Secondary | ICD-10-CM | POA: Diagnosis not present

## 2013-10-24 DIAGNOSIS — D126 Benign neoplasm of colon, unspecified: Secondary | ICD-10-CM | POA: Diagnosis not present

## 2013-10-24 DIAGNOSIS — K6389 Other specified diseases of intestine: Secondary | ICD-10-CM | POA: Diagnosis not present

## 2013-10-24 DIAGNOSIS — Z860100 Personal history of colon polyps, unspecified: Secondary | ICD-10-CM | POA: Diagnosis not present

## 2013-10-24 DIAGNOSIS — K5730 Diverticulosis of large intestine without perforation or abscess without bleeding: Secondary | ICD-10-CM | POA: Diagnosis not present

## 2013-10-30 DIAGNOSIS — I10 Essential (primary) hypertension: Secondary | ICD-10-CM | POA: Diagnosis not present

## 2013-10-30 DIAGNOSIS — H43819 Vitreous degeneration, unspecified eye: Secondary | ICD-10-CM | POA: Diagnosis not present

## 2013-10-30 DIAGNOSIS — Z961 Presence of intraocular lens: Secondary | ICD-10-CM | POA: Diagnosis not present

## 2013-10-30 DIAGNOSIS — Z9849 Cataract extraction status, unspecified eye: Secondary | ICD-10-CM | POA: Diagnosis not present

## 2013-11-09 DIAGNOSIS — G4700 Insomnia, unspecified: Secondary | ICD-10-CM | POA: Diagnosis not present

## 2013-11-09 DIAGNOSIS — M542 Cervicalgia: Secondary | ICD-10-CM | POA: Diagnosis not present

## 2013-11-09 DIAGNOSIS — G2581 Restless legs syndrome: Secondary | ICD-10-CM | POA: Diagnosis not present

## 2013-11-09 DIAGNOSIS — L02419 Cutaneous abscess of limb, unspecified: Secondary | ICD-10-CM | POA: Diagnosis not present

## 2013-11-09 DIAGNOSIS — R609 Edema, unspecified: Secondary | ICD-10-CM | POA: Diagnosis not present

## 2013-11-09 DIAGNOSIS — M79609 Pain in unspecified limb: Secondary | ICD-10-CM | POA: Diagnosis not present

## 2013-11-16 DIAGNOSIS — M5137 Other intervertebral disc degeneration, lumbosacral region: Secondary | ICD-10-CM | POA: Diagnosis not present

## 2013-11-16 DIAGNOSIS — M5126 Other intervertebral disc displacement, lumbar region: Secondary | ICD-10-CM | POA: Diagnosis not present

## 2013-11-16 DIAGNOSIS — M47812 Spondylosis without myelopathy or radiculopathy, cervical region: Secondary | ICD-10-CM | POA: Diagnosis not present

## 2013-11-19 DIAGNOSIS — Z79899 Other long term (current) drug therapy: Secondary | ICD-10-CM | POA: Diagnosis not present

## 2013-11-19 DIAGNOSIS — R0602 Shortness of breath: Secondary | ICD-10-CM | POA: Diagnosis not present

## 2013-11-19 DIAGNOSIS — Z888 Allergy status to other drugs, medicaments and biological substances status: Secondary | ICD-10-CM | POA: Diagnosis not present

## 2013-11-19 DIAGNOSIS — E0590 Thyrotoxicosis, unspecified without thyrotoxic crisis or storm: Secondary | ICD-10-CM | POA: Diagnosis not present

## 2013-11-19 DIAGNOSIS — L259 Unspecified contact dermatitis, unspecified cause: Secondary | ICD-10-CM | POA: Diagnosis not present

## 2013-11-19 DIAGNOSIS — T50995A Adverse effect of other drugs, medicaments and biological substances, initial encounter: Secondary | ICD-10-CM | POA: Diagnosis not present

## 2013-11-19 DIAGNOSIS — I10 Essential (primary) hypertension: Secondary | ICD-10-CM | POA: Diagnosis not present

## 2013-11-19 DIAGNOSIS — L509 Urticaria, unspecified: Secondary | ICD-10-CM | POA: Diagnosis not present

## 2013-11-21 DIAGNOSIS — L719 Rosacea, unspecified: Secondary | ICD-10-CM | POA: Diagnosis not present

## 2013-11-21 DIAGNOSIS — L821 Other seborrheic keratosis: Secondary | ICD-10-CM | POA: Diagnosis not present

## 2013-11-21 DIAGNOSIS — L570 Actinic keratosis: Secondary | ICD-10-CM | POA: Diagnosis not present

## 2013-12-18 DIAGNOSIS — M5030 Other cervical disc degeneration, unspecified cervical region: Secondary | ICD-10-CM | POA: Diagnosis not present

## 2013-12-18 DIAGNOSIS — M79609 Pain in unspecified limb: Secondary | ICD-10-CM | POA: Diagnosis not present

## 2013-12-18 DIAGNOSIS — L299 Pruritus, unspecified: Secondary | ICD-10-CM | POA: Diagnosis not present

## 2013-12-18 DIAGNOSIS — I83893 Varicose veins of bilateral lower extremities with other complications: Secondary | ICD-10-CM | POA: Diagnosis not present

## 2013-12-18 DIAGNOSIS — G4700 Insomnia, unspecified: Secondary | ICD-10-CM | POA: Diagnosis not present

## 2013-12-18 DIAGNOSIS — F411 Generalized anxiety disorder: Secondary | ICD-10-CM | POA: Diagnosis not present

## 2013-12-18 DIAGNOSIS — G2581 Restless legs syndrome: Secondary | ICD-10-CM | POA: Diagnosis not present

## 2013-12-19 DIAGNOSIS — M7989 Other specified soft tissue disorders: Secondary | ICD-10-CM | POA: Diagnosis not present

## 2013-12-19 DIAGNOSIS — I83893 Varicose veins of bilateral lower extremities with other complications: Secondary | ICD-10-CM | POA: Diagnosis not present

## 2013-12-26 DIAGNOSIS — E785 Hyperlipidemia, unspecified: Secondary | ICD-10-CM | POA: Diagnosis not present

## 2013-12-26 DIAGNOSIS — Z79899 Other long term (current) drug therapy: Secondary | ICD-10-CM | POA: Diagnosis not present

## 2013-12-26 DIAGNOSIS — E039 Hypothyroidism, unspecified: Secondary | ICD-10-CM | POA: Diagnosis not present

## 2014-01-09 DIAGNOSIS — M47812 Spondylosis without myelopathy or radiculopathy, cervical region: Secondary | ICD-10-CM | POA: Diagnosis not present

## 2014-01-10 ENCOUNTER — Other Ambulatory Visit: Payer: Self-pay

## 2014-01-10 DIAGNOSIS — Z1231 Encounter for screening mammogram for malignant neoplasm of breast: Secondary | ICD-10-CM

## 2014-01-15 DIAGNOSIS — R609 Edema, unspecified: Secondary | ICD-10-CM | POA: Diagnosis not present

## 2014-01-15 DIAGNOSIS — L719 Rosacea, unspecified: Secondary | ICD-10-CM | POA: Diagnosis not present

## 2014-01-15 DIAGNOSIS — E039 Hypothyroidism, unspecified: Secondary | ICD-10-CM | POA: Diagnosis not present

## 2014-01-15 DIAGNOSIS — I10 Essential (primary) hypertension: Secondary | ICD-10-CM | POA: Diagnosis not present

## 2014-01-15 DIAGNOSIS — G608 Other hereditary and idiopathic neuropathies: Secondary | ICD-10-CM | POA: Diagnosis not present

## 2014-01-16 DIAGNOSIS — H6120 Impacted cerumen, unspecified ear: Secondary | ICD-10-CM | POA: Diagnosis not present

## 2014-01-23 DIAGNOSIS — M25569 Pain in unspecified knee: Secondary | ICD-10-CM | POA: Diagnosis not present

## 2014-02-01 ENCOUNTER — Ambulatory Visit
Admission: RE | Admit: 2014-02-01 | Discharge: 2014-02-01 | Disposition: A | Discharge status: Home / Self Care | Payer: TRICARE For Life (TFL) | Source: Ambulatory Visit

## 2014-02-01 DIAGNOSIS — Z1231 Encounter for screening mammogram for malignant neoplasm of breast: Secondary | ICD-10-CM | POA: Diagnosis not present

## 2014-02-02 DIAGNOSIS — M47812 Spondylosis without myelopathy or radiculopathy, cervical region: Secondary | ICD-10-CM | POA: Diagnosis not present

## 2014-02-02 DIAGNOSIS — M5412 Radiculopathy, cervical region: Secondary | ICD-10-CM | POA: Diagnosis not present

## 2014-02-02 DIAGNOSIS — M5030 Other cervical disc degeneration, unspecified cervical region: Secondary | ICD-10-CM | POA: Diagnosis not present

## 2014-02-05 DIAGNOSIS — E039 Hypothyroidism, unspecified: Secondary | ICD-10-CM | POA: Diagnosis not present

## 2014-02-05 DIAGNOSIS — R059 Cough, unspecified: Secondary | ICD-10-CM | POA: Diagnosis not present

## 2014-02-05 DIAGNOSIS — G2581 Restless legs syndrome: Secondary | ICD-10-CM | POA: Diagnosis not present

## 2014-02-05 DIAGNOSIS — I10 Essential (primary) hypertension: Secondary | ICD-10-CM | POA: Diagnosis not present

## 2014-02-05 DIAGNOSIS — R05 Cough: Secondary | ICD-10-CM | POA: Diagnosis not present

## 2014-02-05 DIAGNOSIS — M79609 Pain in unspecified limb: Secondary | ICD-10-CM | POA: Diagnosis not present

## 2014-02-05 DIAGNOSIS — R609 Edema, unspecified: Secondary | ICD-10-CM | POA: Diagnosis not present

## 2014-02-06 DIAGNOSIS — R002 Palpitations: Secondary | ICD-10-CM | POA: Diagnosis not present

## 2014-02-06 DIAGNOSIS — I2510 Atherosclerotic heart disease of native coronary artery without angina pectoris: Secondary | ICD-10-CM | POA: Diagnosis not present

## 2014-02-06 DIAGNOSIS — E039 Hypothyroidism, unspecified: Secondary | ICD-10-CM | POA: Diagnosis not present

## 2014-02-06 DIAGNOSIS — R0602 Shortness of breath: Secondary | ICD-10-CM | POA: Diagnosis not present

## 2014-02-06 DIAGNOSIS — I10 Essential (primary) hypertension: Secondary | ICD-10-CM | POA: Diagnosis not present

## 2014-02-06 DIAGNOSIS — I7090 Unspecified atherosclerosis: Secondary | ICD-10-CM | POA: Diagnosis not present

## 2014-02-14 DIAGNOSIS — I2510 Atherosclerotic heart disease of native coronary artery without angina pectoris: Secondary | ICD-10-CM | POA: Diagnosis not present

## 2014-02-16 DIAGNOSIS — M47812 Spondylosis without myelopathy or radiculopathy, cervical region: Secondary | ICD-10-CM | POA: Diagnosis not present

## 2014-02-16 DIAGNOSIS — M5030 Other cervical disc degeneration, unspecified cervical region: Secondary | ICD-10-CM | POA: Diagnosis not present

## 2014-02-16 DIAGNOSIS — M5412 Radiculopathy, cervical region: Secondary | ICD-10-CM | POA: Diagnosis not present

## 2014-03-07 DIAGNOSIS — R0602 Shortness of breath: Secondary | ICD-10-CM | POA: Diagnosis not present

## 2014-03-07 DIAGNOSIS — I471 Supraventricular tachycardia: Secondary | ICD-10-CM | POA: Diagnosis not present

## 2014-03-07 DIAGNOSIS — I10 Essential (primary) hypertension: Secondary | ICD-10-CM | POA: Diagnosis not present

## 2014-03-07 DIAGNOSIS — I2510 Atherosclerotic heart disease of native coronary artery without angina pectoris: Secondary | ICD-10-CM | POA: Diagnosis not present

## 2014-03-08 DIAGNOSIS — Z96659 Presence of unspecified artificial knee joint: Secondary | ICD-10-CM | POA: Diagnosis not present

## 2014-03-29 DIAGNOSIS — I10 Essential (primary) hypertension: Secondary | ICD-10-CM | POA: Diagnosis not present

## 2014-03-29 DIAGNOSIS — G4700 Insomnia, unspecified: Secondary | ICD-10-CM | POA: Diagnosis not present

## 2014-03-29 DIAGNOSIS — R609 Edema, unspecified: Secondary | ICD-10-CM | POA: Diagnosis not present

## 2014-03-29 DIAGNOSIS — M79609 Pain in unspecified limb: Secondary | ICD-10-CM | POA: Diagnosis not present

## 2014-03-29 DIAGNOSIS — G2581 Restless legs syndrome: Secondary | ICD-10-CM | POA: Diagnosis not present

## 2014-03-29 DIAGNOSIS — M6789 Other specified disorders of synovium and tendon, multiple sites: Secondary | ICD-10-CM | POA: Diagnosis not present

## 2014-04-11 DIAGNOSIS — I10 Essential (primary) hypertension: Secondary | ICD-10-CM | POA: Diagnosis not present

## 2014-04-11 DIAGNOSIS — R002 Palpitations: Secondary | ICD-10-CM | POA: Diagnosis not present

## 2014-04-11 DIAGNOSIS — R0602 Shortness of breath: Secondary | ICD-10-CM | POA: Diagnosis not present

## 2014-04-11 DIAGNOSIS — I7090 Unspecified atherosclerosis: Secondary | ICD-10-CM | POA: Diagnosis not present

## 2014-04-11 DIAGNOSIS — I2510 Atherosclerotic heart disease of native coronary artery without angina pectoris: Secondary | ICD-10-CM | POA: Diagnosis not present

## 2014-04-12 DIAGNOSIS — G2581 Restless legs syndrome: Secondary | ICD-10-CM | POA: Diagnosis not present

## 2014-04-12 DIAGNOSIS — G603 Idiopathic progressive neuropathy: Secondary | ICD-10-CM | POA: Diagnosis not present

## 2014-04-17 DIAGNOSIS — M545 Low back pain, unspecified: Secondary | ICD-10-CM | POA: Diagnosis not present

## 2014-04-18 DIAGNOSIS — M5126 Other intervertebral disc displacement, lumbar region: Secondary | ICD-10-CM | POA: Diagnosis not present

## 2014-04-18 DIAGNOSIS — R609 Edema, unspecified: Secondary | ICD-10-CM | POA: Diagnosis not present

## 2014-04-18 DIAGNOSIS — IMO0002 Reserved for concepts with insufficient information to code with codable children: Secondary | ICD-10-CM | POA: Diagnosis not present

## 2014-04-18 DIAGNOSIS — M48061 Spinal stenosis, lumbar region without neurogenic claudication: Secondary | ICD-10-CM | POA: Diagnosis not present

## 2014-04-18 DIAGNOSIS — M47817 Spondylosis without myelopathy or radiculopathy, lumbosacral region: Secondary | ICD-10-CM | POA: Diagnosis not present

## 2014-04-18 DIAGNOSIS — M79609 Pain in unspecified limb: Secondary | ICD-10-CM | POA: Diagnosis not present

## 2014-04-24 DIAGNOSIS — M48061 Spinal stenosis, lumbar region without neurogenic claudication: Secondary | ICD-10-CM | POA: Diagnosis not present

## 2014-05-01 DIAGNOSIS — L719 Rosacea, unspecified: Secondary | ICD-10-CM | POA: Diagnosis not present

## 2014-05-01 DIAGNOSIS — L97909 Non-pressure chronic ulcer of unspecified part of unspecified lower leg with unspecified severity: Secondary | ICD-10-CM | POA: Diagnosis not present

## 2014-05-01 DIAGNOSIS — I8310 Varicose veins of unspecified lower extremity with inflammation: Secondary | ICD-10-CM | POA: Diagnosis not present

## 2014-05-01 DIAGNOSIS — R609 Edema, unspecified: Secondary | ICD-10-CM | POA: Diagnosis not present

## 2014-05-04 DIAGNOSIS — R609 Edema, unspecified: Secondary | ICD-10-CM | POA: Diagnosis not present

## 2014-05-04 DIAGNOSIS — M48061 Spinal stenosis, lumbar region without neurogenic claudication: Secondary | ICD-10-CM | POA: Diagnosis not present

## 2014-05-04 DIAGNOSIS — M48062 Spinal stenosis, lumbar region with neurogenic claudication: Secondary | ICD-10-CM | POA: Diagnosis not present

## 2014-05-04 DIAGNOSIS — IMO0002 Reserved for concepts with insufficient information to code with codable children: Secondary | ICD-10-CM | POA: Diagnosis not present

## 2014-05-04 DIAGNOSIS — I8310 Varicose veins of unspecified lower extremity with inflammation: Secondary | ICD-10-CM | POA: Diagnosis not present

## 2014-05-07 DIAGNOSIS — G603 Idiopathic progressive neuropathy: Secondary | ICD-10-CM | POA: Diagnosis not present

## 2014-05-07 DIAGNOSIS — E039 Hypothyroidism, unspecified: Secondary | ICD-10-CM | POA: Diagnosis not present

## 2014-05-07 DIAGNOSIS — R609 Edema, unspecified: Secondary | ICD-10-CM | POA: Diagnosis not present

## 2014-05-07 DIAGNOSIS — I10 Essential (primary) hypertension: Secondary | ICD-10-CM | POA: Diagnosis not present

## 2014-05-07 DIAGNOSIS — K219 Gastro-esophageal reflux disease without esophagitis: Secondary | ICD-10-CM | POA: Diagnosis not present

## 2014-05-07 DIAGNOSIS — R350 Frequency of micturition: Secondary | ICD-10-CM | POA: Diagnosis not present

## 2014-05-07 DIAGNOSIS — J019 Acute sinusitis, unspecified: Secondary | ICD-10-CM | POA: Diagnosis not present

## 2014-05-08 DIAGNOSIS — R7301 Impaired fasting glucose: Secondary | ICD-10-CM | POA: Diagnosis not present

## 2014-05-08 DIAGNOSIS — Z79899 Other long term (current) drug therapy: Secondary | ICD-10-CM | POA: Diagnosis not present

## 2014-05-08 DIAGNOSIS — E785 Hyperlipidemia, unspecified: Secondary | ICD-10-CM | POA: Diagnosis not present

## 2014-05-08 DIAGNOSIS — E039 Hypothyroidism, unspecified: Secondary | ICD-10-CM | POA: Diagnosis not present

## 2014-05-08 DIAGNOSIS — I10 Essential (primary) hypertension: Secondary | ICD-10-CM | POA: Diagnosis not present

## 2014-05-15 DIAGNOSIS — M5412 Radiculopathy, cervical region: Secondary | ICD-10-CM | POA: Diagnosis not present

## 2014-05-25 DIAGNOSIS — IMO0002 Reserved for concepts with insufficient information to code with codable children: Secondary | ICD-10-CM | POA: Diagnosis not present

## 2014-05-25 DIAGNOSIS — M48061 Spinal stenosis, lumbar region without neurogenic claudication: Secondary | ICD-10-CM | POA: Diagnosis not present

## 2014-05-25 DIAGNOSIS — M545 Low back pain, unspecified: Secondary | ICD-10-CM | POA: Diagnosis not present

## 2014-05-29 DIAGNOSIS — R0602 Shortness of breath: Secondary | ICD-10-CM | POA: Diagnosis not present

## 2014-05-29 DIAGNOSIS — I10 Essential (primary) hypertension: Secondary | ICD-10-CM | POA: Diagnosis not present

## 2014-05-29 DIAGNOSIS — J984 Other disorders of lung: Secondary | ICD-10-CM | POA: Diagnosis not present

## 2014-05-29 DIAGNOSIS — R079 Chest pain, unspecified: Secondary | ICD-10-CM | POA: Diagnosis not present

## 2014-05-29 DIAGNOSIS — Z79899 Other long term (current) drug therapy: Secondary | ICD-10-CM | POA: Diagnosis not present

## 2014-05-29 DIAGNOSIS — R072 Precordial pain: Secondary | ICD-10-CM | POA: Diagnosis not present

## 2014-05-29 DIAGNOSIS — E785 Hyperlipidemia, unspecified: Secondary | ICD-10-CM | POA: Diagnosis not present

## 2014-06-04 DIAGNOSIS — I2510 Atherosclerotic heart disease of native coronary artery without angina pectoris: Secondary | ICD-10-CM | POA: Diagnosis not present

## 2014-06-04 DIAGNOSIS — R059 Cough, unspecified: Secondary | ICD-10-CM | POA: Diagnosis not present

## 2014-06-04 DIAGNOSIS — E785 Hyperlipidemia, unspecified: Secondary | ICD-10-CM | POA: Diagnosis not present

## 2014-06-04 DIAGNOSIS — R079 Chest pain, unspecified: Secondary | ICD-10-CM | POA: Diagnosis not present

## 2014-06-04 DIAGNOSIS — R05 Cough: Secondary | ICD-10-CM | POA: Diagnosis not present

## 2014-06-05 DIAGNOSIS — G2581 Restless legs syndrome: Secondary | ICD-10-CM | POA: Diagnosis not present

## 2014-06-05 DIAGNOSIS — G603 Idiopathic progressive neuropathy: Secondary | ICD-10-CM | POA: Diagnosis not present

## 2014-06-07 DIAGNOSIS — I8310 Varicose veins of unspecified lower extremity with inflammation: Secondary | ICD-10-CM | POA: Diagnosis not present

## 2014-06-07 DIAGNOSIS — R609 Edema, unspecified: Secondary | ICD-10-CM | POA: Diagnosis not present

## 2014-06-07 DIAGNOSIS — L719 Rosacea, unspecified: Secondary | ICD-10-CM | POA: Diagnosis not present

## 2014-06-08 DIAGNOSIS — M48061 Spinal stenosis, lumbar region without neurogenic claudication: Secondary | ICD-10-CM | POA: Diagnosis not present

## 2014-06-08 DIAGNOSIS — IMO0002 Reserved for concepts with insufficient information to code with codable children: Secondary | ICD-10-CM | POA: Diagnosis not present

## 2014-06-08 DIAGNOSIS — M545 Low back pain, unspecified: Secondary | ICD-10-CM | POA: Diagnosis not present

## 2014-06-14 DIAGNOSIS — I2510 Atherosclerotic heart disease of native coronary artery without angina pectoris: Secondary | ICD-10-CM | POA: Diagnosis not present

## 2014-06-14 DIAGNOSIS — I10 Essential (primary) hypertension: Secondary | ICD-10-CM | POA: Diagnosis not present

## 2014-06-14 DIAGNOSIS — R0602 Shortness of breath: Secondary | ICD-10-CM | POA: Diagnosis not present

## 2014-06-14 DIAGNOSIS — R0789 Other chest pain: Secondary | ICD-10-CM | POA: Diagnosis not present

## 2014-06-19 DIAGNOSIS — R0789 Other chest pain: Secondary | ICD-10-CM | POA: Diagnosis not present

## 2014-06-19 DIAGNOSIS — I2510 Atherosclerotic heart disease of native coronary artery without angina pectoris: Secondary | ICD-10-CM | POA: Diagnosis not present

## 2014-06-19 DIAGNOSIS — R0602 Shortness of breath: Secondary | ICD-10-CM | POA: Diagnosis not present

## 2014-06-25 DIAGNOSIS — R0602 Shortness of breath: Secondary | ICD-10-CM | POA: Diagnosis not present

## 2014-06-26 DIAGNOSIS — M48061 Spinal stenosis, lumbar region without neurogenic claudication: Secondary | ICD-10-CM | POA: Diagnosis not present

## 2014-07-12 DIAGNOSIS — I2510 Atherosclerotic heart disease of native coronary artery without angina pectoris: Secondary | ICD-10-CM | POA: Diagnosis not present

## 2014-07-12 DIAGNOSIS — I10 Essential (primary) hypertension: Secondary | ICD-10-CM | POA: Diagnosis not present

## 2014-07-12 DIAGNOSIS — I471 Supraventricular tachycardia: Secondary | ICD-10-CM | POA: Diagnosis not present

## 2014-07-12 DIAGNOSIS — R0789 Other chest pain: Secondary | ICD-10-CM | POA: Diagnosis not present

## 2014-07-19 DIAGNOSIS — J309 Allergic rhinitis, unspecified: Secondary | ICD-10-CM | POA: Diagnosis not present

## 2014-07-19 DIAGNOSIS — J4530 Mild persistent asthma, uncomplicated: Secondary | ICD-10-CM | POA: Diagnosis not present

## 2014-07-24 DIAGNOSIS — H43811 Vitreous degeneration, right eye: Secondary | ICD-10-CM | POA: Diagnosis not present

## 2014-07-24 DIAGNOSIS — H02839 Dermatochalasis of unspecified eye, unspecified eyelid: Secondary | ICD-10-CM | POA: Diagnosis not present

## 2014-07-24 DIAGNOSIS — Z961 Presence of intraocular lens: Secondary | ICD-10-CM | POA: Diagnosis not present

## 2014-08-06 DIAGNOSIS — J4540 Moderate persistent asthma, uncomplicated: Secondary | ICD-10-CM | POA: Diagnosis not present

## 2014-08-28 DIAGNOSIS — M5136 Other intervertebral disc degeneration, lumbar region: Secondary | ICD-10-CM | POA: Diagnosis not present

## 2014-08-28 DIAGNOSIS — M4727 Other spondylosis with radiculopathy, lumbosacral region: Secondary | ICD-10-CM | POA: Diagnosis not present

## 2014-09-20 DIAGNOSIS — R7301 Impaired fasting glucose: Secondary | ICD-10-CM | POA: Diagnosis not present

## 2014-09-20 DIAGNOSIS — M62831 Muscle spasm of calf: Secondary | ICD-10-CM | POA: Diagnosis not present

## 2014-09-20 DIAGNOSIS — E559 Vitamin D deficiency, unspecified: Secondary | ICD-10-CM | POA: Diagnosis not present

## 2014-09-20 DIAGNOSIS — E039 Hypothyroidism, unspecified: Secondary | ICD-10-CM | POA: Diagnosis not present

## 2014-09-20 DIAGNOSIS — Z79899 Other long term (current) drug therapy: Secondary | ICD-10-CM | POA: Diagnosis not present

## 2014-09-20 DIAGNOSIS — I10 Essential (primary) hypertension: Secondary | ICD-10-CM | POA: Diagnosis not present

## 2014-09-20 DIAGNOSIS — M47816 Spondylosis without myelopathy or radiculopathy, lumbar region: Secondary | ICD-10-CM | POA: Diagnosis not present

## 2014-09-20 DIAGNOSIS — E785 Hyperlipidemia, unspecified: Secondary | ICD-10-CM | POA: Diagnosis not present

## 2014-09-20 DIAGNOSIS — G603 Idiopathic progressive neuropathy: Secondary | ICD-10-CM | POA: Diagnosis not present

## 2014-10-02 DIAGNOSIS — R7989 Other specified abnormal findings of blood chemistry: Secondary | ICD-10-CM | POA: Diagnosis not present

## 2014-10-15 DIAGNOSIS — M25561 Pain in right knee: Secondary | ICD-10-CM | POA: Diagnosis not present

## 2014-10-15 DIAGNOSIS — T148 Other injury of unspecified body region: Secondary | ICD-10-CM | POA: Diagnosis not present

## 2014-10-15 DIAGNOSIS — R748 Abnormal levels of other serum enzymes: Secondary | ICD-10-CM | POA: Diagnosis not present

## 2014-10-15 DIAGNOSIS — J0100 Acute maxillary sinusitis, unspecified: Secondary | ICD-10-CM | POA: Diagnosis not present

## 2014-10-16 DIAGNOSIS — M25561 Pain in right knee: Secondary | ICD-10-CM | POA: Diagnosis not present

## 2014-10-18 DIAGNOSIS — L820 Inflamed seborrheic keratosis: Secondary | ICD-10-CM | POA: Diagnosis not present

## 2014-10-22 DIAGNOSIS — M545 Low back pain: Secondary | ICD-10-CM | POA: Diagnosis not present

## 2014-10-22 DIAGNOSIS — M4806 Spinal stenosis, lumbar region: Secondary | ICD-10-CM | POA: Diagnosis not present

## 2014-10-29 DIAGNOSIS — H04123 Dry eye syndrome of bilateral lacrimal glands: Secondary | ICD-10-CM | POA: Diagnosis not present

## 2014-11-07 DIAGNOSIS — S39012D Strain of muscle, fascia and tendon of lower back, subsequent encounter: Secondary | ICD-10-CM | POA: Diagnosis not present

## 2014-11-12 ENCOUNTER — Encounter: Payer: Self-pay | Admitting: Critical Care Medicine

## 2014-11-13 ENCOUNTER — Ambulatory Visit (INDEPENDENT_AMBULATORY_CARE_PROVIDER_SITE_OTHER): Payer: Medicare Other | Admitting: Critical Care Medicine

## 2014-11-13 ENCOUNTER — Encounter: Payer: Self-pay | Admitting: Critical Care Medicine

## 2014-11-13 VITALS — BP 134/76 | HR 69 | Temp 98.2°F | Ht 62.0 in | Wt 209.0 lb

## 2014-11-13 DIAGNOSIS — K5730 Diverticulosis of large intestine without perforation or abscess without bleeding: Secondary | ICD-10-CM

## 2014-11-13 DIAGNOSIS — K219 Gastro-esophageal reflux disease without esophagitis: Secondary | ICD-10-CM | POA: Diagnosis not present

## 2014-11-13 DIAGNOSIS — R062 Wheezing: Secondary | ICD-10-CM | POA: Diagnosis not present

## 2014-11-13 DIAGNOSIS — J302 Other seasonal allergic rhinitis: Secondary | ICD-10-CM

## 2014-11-13 DIAGNOSIS — G2581 Restless legs syndrome: Secondary | ICD-10-CM

## 2014-11-13 DIAGNOSIS — R05 Cough: Secondary | ICD-10-CM | POA: Diagnosis not present

## 2014-11-13 DIAGNOSIS — F329 Major depressive disorder, single episode, unspecified: Secondary | ICD-10-CM

## 2014-11-13 DIAGNOSIS — R0602 Shortness of breath: Secondary | ICD-10-CM | POA: Diagnosis not present

## 2014-11-13 DIAGNOSIS — I10 Essential (primary) hypertension: Secondary | ICD-10-CM | POA: Insufficient documentation

## 2014-11-13 DIAGNOSIS — F32A Depression, unspecified: Secondary | ICD-10-CM | POA: Insufficient documentation

## 2014-11-13 DIAGNOSIS — J309 Allergic rhinitis, unspecified: Secondary | ICD-10-CM | POA: Insufficient documentation

## 2014-11-13 DIAGNOSIS — M797 Fibromyalgia: Secondary | ICD-10-CM | POA: Insufficient documentation

## 2014-11-13 DIAGNOSIS — E039 Hypothyroidism, unspecified: Secondary | ICD-10-CM

## 2014-11-13 DIAGNOSIS — E785 Hyperlipidemia, unspecified: Secondary | ICD-10-CM

## 2014-11-13 DIAGNOSIS — M48062 Spinal stenosis, lumbar region with neurogenic claudication: Secondary | ICD-10-CM

## 2014-11-13 DIAGNOSIS — M47812 Spondylosis without myelopathy or radiculopathy, cervical region: Secondary | ICD-10-CM | POA: Insufficient documentation

## 2014-11-13 DIAGNOSIS — J4540 Moderate persistent asthma, uncomplicated: Secondary | ICD-10-CM | POA: Insufficient documentation

## 2014-11-13 DIAGNOSIS — I517 Cardiomegaly: Secondary | ICD-10-CM | POA: Diagnosis not present

## 2014-11-13 DIAGNOSIS — K5790 Diverticulosis of intestine, part unspecified, without perforation or abscess without bleeding: Secondary | ICD-10-CM | POA: Insufficient documentation

## 2014-11-13 MED ORDER — ALBUTEROL SULFATE HFA 108 (90 BASE) MCG/ACT IN AERS: 2.0000 | INHALATION_SPRAY | Freq: Four times a day (QID) | RESPIRATORY_TRACT | Status: AC | PRN

## 2014-11-13 MED ORDER — FLUTICASONE PROPIONATE HFA 110 MCG/ACT IN AERO: INHALATION_SPRAY | RESPIRATORY_TRACT | Status: AC

## 2014-11-13 NOTE — Progress Notes (Signed)
Subjective:    Patient ID: Kiara Taylor, female    DOB: 1933/05/21, 81 y.o.   MRN: [id number]  HPI Comments: Chronic dyspnea on exertion and rest for 1 year, gradually worse over time.  On no oxygen.  No real cough .  Notes some postnasal drip.  Recent rx for sinus infection, dyspnea worse while with the sinus infection  Shortness of Breath This is a chronic problem. The current episode started more than 1 year ago. The problem occurs constantly. The problem has been gradually worsening. Associated symptoms include leg swelling, PND and wheezing. Pertinent negatives include no abdominal pain, chest pain, ear pain, fever, headaches, hemoptysis, leg pain, neck pain, orthopnea, rhinorrhea, sore throat or sputum production. The symptoms are aggravated by any activity, exercise, URIs and weather changes. Risk factors include smoking (quit smoking in 1972). She has tried beta agonist inhalers for the symptoms. The treatment provided moderate relief. Her past medical history is significant for allergies. There is no history of aspirin allergies, asthma, bronchiolitis, CAD, chronic lung disease, COPD, DVT, a heart failure, PE, pneumonia or a recent surgery.   Past Medical History  Diagnosis Date  . Hypertension   . Fibromyalgia   . Depression   . GERD (gastroesophageal reflux disease)   . Asthma   . Hypothyroid   . Osteoarthritis   . Esophageal reflux   . TIA (transient ischemic attack)   . Allergic rhinitis   . Rosacea   . Hyperlipemia   . Geographic tongue   . Diverticulosis   . Colon polyps   . DJD (degenerative joint disease) of cervical spine   . DDD (degenerative disc disease), cervical   . Insomnia   . RLS (restless legs syndrome)   . Idiopathic progressive polyneuropathy   . Lumbar stenosis with neurogenic claudication      Family History  Problem Relation Age of Onset  . Colon cancer Father   . Stroke Mother   . Heart disease Father   . Prostate cancer Brother       History   Social History  . Marital Status: Married    Spouse Name: N/A    Number of Children: N/A  . Years of Education: N/A   Occupational History  . Retired     Estate agent at [employer]  . Smoking status: Former Smoker -- 0.50 packs/day for 18 years    Types: Cigarettes    Quit date: 10/12/1970  . Smokeless tobacco: Never Used  . Alcohol Use: 0.0 oz/week    0 Glasses of wine per week     Comment: glass with dinner occasionally  . Drug Use: No  . Sexual Activity: Not on file   Other Topics Concern  . Not on file   Social History Narrative     Allergies  Allergen Reactions  . Contrast Media [Iodinated Diagnostic Agents]   . Keflex [Cephalexin]     anaphylaxis  . Oxycodone     Stomach upset  . Singulair [Montelukast Sodium]     epistaxis  . Sulfa Antibiotics     rash  . Trazodone And Nefazodone     Night mares     Outpatient Prescriptions Prior to Visit  Medication Sig Dispense Refill  . aspirin 325 MG tablet Take 325 mg by mouth daily.    Marland Kitchen estrogens, conjugated, (PREMARIN) 0.3 MG tablet Take 0.3 mg by mouth daily. Take daily for 21 days then do not take for 7 days.    Marland Kitchen  omeprazole (PRILOSEC) 40 MG capsule Take 40 mg by mouth daily.    Marland Kitchen atorvastatin (LIPITOR) 40 MG tablet Take 40 mg by mouth daily.    Marland Kitchen levothyroxine (SYNTHROID, LEVOTHROID) 75 MCG tablet Take 75 mcg by mouth daily.    . pregabalin (LYRICA) 75 MG capsule Take 75 mg by mouth 2 (two) times daily.    Marland Kitchen telmisartan (MICARDIS) 40 MG tablet Take 40 mg by mouth daily.     No facility-administered medications prior to visit.       Review of Systems  Constitutional: Positive for fatigue. Negative for fever.  HENT: Positive for postnasal drip and sinus pressure. Negative for ear pain, rhinorrhea, sneezing, sore throat, trouble swallowing and voice change.   Respiratory: Positive for shortness of breath and wheezing. Negative for cough, hemoptysis, sputum production and  chest tightness.   Cardiovascular: Positive for leg swelling and PND. Negative for chest pain and orthopnea.  Gastrointestinal: Negative for abdominal pain.       Hx of GERD , meds help.   Musculoskeletal: Negative for neck pain.  Neurological: Negative for headaches.       Objective:   Physical Exam Filed Vitals:   11/13/14 1610  BP: 134/76  Pulse: 69  Temp: 98.2 F (36.8 C)  TempSrc: Oral  Height: 5\' 2"  (1.575 m)  Weight: 209 lb (94.802 kg)  SpO2: 92%    Gen: Pleasant, well-nourished, in no distress,  normal affect  ENT: No lesions,  mouth clear,  oropharynx clear, no postnasal drip  Neck: No JVD, no TMG, no carotid bruits  Lungs: No use of accessory muscles, no dullness to percussion,distant BS, exp wheezes.  Cardiovascular: RRR, heart sounds normal, no murmur or gallops, no peripheral edema  Abdomen: soft and NT, no HSM,  BS normal  Musculoskeletal: No deformities, no cyanosis or clubbing  Neuro: alert, non focal  Skin: Warm, no lesions or rashes  No results found.        Assessment & Plan:   Asthma, moderate persistent Moderate persistent asthma with non-adherence to inhaled steroids Significant atopic features Mild to moderate obstruction on recent pulmonary function testing Plan Use flovent two puff twice daily, use spacer provided Albuterol as needed    Updated Medication List Outpatient Encounter Prescriptions as of 11/13/2014  Medication Sig  . aspirin 325 MG tablet Take 325 mg by mouth daily.  . Biotin 5 MG CAPS Take by mouth daily.  . Cholecalciferol (VITAMIN D3) 2000 UNITS capsule Take 2,000 Units by mouth daily.  Marland Kitchen estrogens, conjugated, (PREMARIN) 0.3 MG tablet Take 0.3 mg by mouth daily. Take daily for 21 days then do not take for 7 days.  . furosemide (LASIX) 40 MG tablet Take 40 mg by mouth as needed.  Marland Kitchen levothyroxine (SYNTHROID, LEVOTHROID) 100 MCG tablet Take 100 mcg by mouth daily before breakfast.  . Multiple Vitamins-Minerals  (OCUVITE ADULT 50+) CAPS daily. :  . omeprazole (PRILOSEC) 40 MG capsule Take 40 mg by mouth daily.  . pregabalin (LYRICA) 50 MG capsule Take 50 mg by mouth 3 (three) times daily.  Marland Kitchen telmisartan (MICARDIS) 20 MG tablet Take 20 mg by mouth daily.  . temazepam (RESTORIL) 30 MG capsule Take 1 capsule by mouth at bedtime as needed.  Marland Kitchen albuterol (PROAIR HFA) 108 (90 BASE) MCG/ACT inhaler Inhale 2 puffs into the lungs every 6 (six) hours as needed for wheezing or shortness of breath.  . fluticasone (FLOVENT HFA) 110 MCG/ACT inhaler Two puff twice daily  . [DISCONTINUED] atorvastatin (LIPITOR)  40 MG tablet Take 40 mg by mouth daily.  . [DISCONTINUED] fluticasone (FLOVENT HFA) 110 MCG/ACT inhaler Inhale 4-5 puffs into the lungs as needed.  . [DISCONTINUED] levothyroxine (SYNTHROID, LEVOTHROID) 75 MCG tablet Take 75 mcg by mouth daily.  . [DISCONTINUED] pregabalin (LYRICA) 75 MG capsule Take 75 mg by mouth 2 (two) times daily.  . [DISCONTINUED] telmisartan (MICARDIS) 40 MG tablet Take 40 mg by mouth daily.

## 2014-11-13 NOTE — Patient Instructions (Signed)
Use flovent two puff twice daily, use spacer provided Albuterol as needed Chest xray today  Return 3 months

## 2014-11-13 NOTE — Assessment & Plan Note (Signed)
Moderate persistent asthma with non-adherence to inhaled steroids Significant atopic features Mild to moderate obstruction on recent pulmonary function testing Plan Use flovent two puff twice daily, use spacer provided Albuterol as needed

## 2014-11-19 ENCOUNTER — Telehealth: Payer: Self-pay | Admitting: Critical Care Medicine

## 2014-11-19 DIAGNOSIS — J4540 Moderate persistent asthma, uncomplicated: Secondary | ICD-10-CM

## 2014-11-19 NOTE — Telephone Encounter (Signed)
Elsie Stain, MD at 11/19/2014 11:29 AM     Status: Signed       Expand All Collapse All   Call pt and tell her CXR is clear, no active process  No change in medications     ------  Spoke with pt.  Discussed cxr results and recs per Dr. Joya Gaskins.  She verbalized understanding and voiced no further questions or concerns at this time.

## 2014-11-19 NOTE — Telephone Encounter (Signed)
lmomtcb on pt's home # ATC cell # - NA and no option to leave msg. WCB

## 2014-11-19 NOTE — Telephone Encounter (Signed)
Call pt and tell her CXR is clear, no active process  No change in medications

## 2014-11-19 NOTE — Telephone Encounter (Signed)
Pt aware of results and recs - see 11/19/14 phone msg for additional information.

## 2014-11-19 NOTE — Telephone Encounter (Signed)
Msg closed in error.  Will route to my box to f/u on.

## 2014-12-11 DIAGNOSIS — M545 Low back pain: Secondary | ICD-10-CM | POA: Diagnosis not present

## 2014-12-12 ENCOUNTER — Encounter: Payer: Self-pay | Admitting: Critical Care Medicine

## 2014-12-27 DIAGNOSIS — M25562 Pain in left knee: Secondary | ICD-10-CM | POA: Diagnosis not present

## 2014-12-27 DIAGNOSIS — M25552 Pain in left hip: Secondary | ICD-10-CM | POA: Diagnosis not present

## 2015-01-08 DIAGNOSIS — M7062 Trochanteric bursitis, left hip: Secondary | ICD-10-CM | POA: Diagnosis not present

## 2015-01-15 DIAGNOSIS — G603 Idiopathic progressive neuropathy: Secondary | ICD-10-CM | POA: Diagnosis not present

## 2015-01-15 DIAGNOSIS — R748 Abnormal levels of other serum enzymes: Secondary | ICD-10-CM | POA: Diagnosis not present

## 2015-01-15 DIAGNOSIS — E785 Hyperlipidemia, unspecified: Secondary | ICD-10-CM | POA: Diagnosis not present

## 2015-01-15 DIAGNOSIS — Z79899 Other long term (current) drug therapy: Secondary | ICD-10-CM | POA: Diagnosis not present

## 2015-01-15 DIAGNOSIS — E039 Hypothyroidism, unspecified: Secondary | ICD-10-CM | POA: Diagnosis not present

## 2015-01-15 DIAGNOSIS — M5136 Other intervertebral disc degeneration, lumbar region: Secondary | ICD-10-CM | POA: Diagnosis not present

## 2015-01-15 DIAGNOSIS — I10 Essential (primary) hypertension: Secondary | ICD-10-CM | POA: Diagnosis not present

## 2015-01-22 DIAGNOSIS — H04121 Dry eye syndrome of right lacrimal gland: Secondary | ICD-10-CM | POA: Diagnosis not present

## 2015-01-22 DIAGNOSIS — Z961 Presence of intraocular lens: Secondary | ICD-10-CM | POA: Diagnosis not present

## 2015-01-22 DIAGNOSIS — H04122 Dry eye syndrome of left lacrimal gland: Secondary | ICD-10-CM | POA: Diagnosis not present

## 2015-01-22 DIAGNOSIS — H43811 Vitreous degeneration, right eye: Secondary | ICD-10-CM | POA: Diagnosis not present

## 2015-01-22 DIAGNOSIS — H02839 Dermatochalasis of unspecified eye, unspecified eyelid: Secondary | ICD-10-CM | POA: Diagnosis not present

## 2015-03-07 DIAGNOSIS — M7062 Trochanteric bursitis, left hip: Secondary | ICD-10-CM | POA: Diagnosis not present

## 2015-03-14 DIAGNOSIS — M25552 Pain in left hip: Secondary | ICD-10-CM | POA: Diagnosis not present

## 2015-03-14 DIAGNOSIS — R2689 Other abnormalities of gait and mobility: Secondary | ICD-10-CM | POA: Diagnosis not present

## 2015-03-28 DIAGNOSIS — M25452 Effusion, left hip: Secondary | ICD-10-CM | POA: Diagnosis not present

## 2015-03-28 DIAGNOSIS — M66862 Spontaneous rupture of other tendons, left lower leg: Secondary | ICD-10-CM | POA: Diagnosis not present

## 2015-03-28 DIAGNOSIS — M7062 Trochanteric bursitis, left hip: Secondary | ICD-10-CM | POA: Diagnosis not present

## 2015-03-28 DIAGNOSIS — M71852 Other specified bursopathies, left hip: Secondary | ICD-10-CM | POA: Diagnosis not present

## 2015-03-28 DIAGNOSIS — M7602 Gluteal tendinitis, left hip: Secondary | ICD-10-CM | POA: Diagnosis not present

## 2015-04-01 ENCOUNTER — Other Ambulatory Visit: Payer: Self-pay

## 2015-04-01 DIAGNOSIS — S76012A Strain of muscle, fascia and tendon of left hip, initial encounter: Secondary | ICD-10-CM | POA: Diagnosis not present

## 2015-04-01 DIAGNOSIS — M7062 Trochanteric bursitis, left hip: Secondary | ICD-10-CM | POA: Diagnosis not present

## 2015-04-01 DIAGNOSIS — Z1231 Encounter for screening mammogram for malignant neoplasm of breast: Secondary | ICD-10-CM

## 2015-04-05 DIAGNOSIS — R2689 Other abnormalities of gait and mobility: Secondary | ICD-10-CM | POA: Diagnosis not present

## 2015-04-05 DIAGNOSIS — M25552 Pain in left hip: Secondary | ICD-10-CM | POA: Diagnosis not present

## 2015-04-09 DIAGNOSIS — R2689 Other abnormalities of gait and mobility: Secondary | ICD-10-CM | POA: Diagnosis not present

## 2015-04-09 DIAGNOSIS — M25552 Pain in left hip: Secondary | ICD-10-CM | POA: Diagnosis not present

## 2015-04-10 DIAGNOSIS — I82403 Acute embolism and thrombosis of unspecified deep veins of lower extremity, bilateral: Secondary | ICD-10-CM | POA: Diagnosis not present

## 2015-04-10 DIAGNOSIS — I824Z3 Acute embolism and thrombosis of unspecified deep veins of distal lower extremity, bilateral: Secondary | ICD-10-CM | POA: Diagnosis not present

## 2015-04-10 DIAGNOSIS — E780 Pure hypercholesterolemia: Secondary | ICD-10-CM | POA: Diagnosis not present

## 2015-04-10 DIAGNOSIS — I2510 Atherosclerotic heart disease of native coronary artery without angina pectoris: Secondary | ICD-10-CM | POA: Diagnosis not present

## 2015-04-10 DIAGNOSIS — E039 Hypothyroidism, unspecified: Secondary | ICD-10-CM | POA: Diagnosis not present

## 2015-04-10 DIAGNOSIS — Z7982 Long term (current) use of aspirin: Secondary | ICD-10-CM | POA: Diagnosis not present

## 2015-04-10 DIAGNOSIS — I10 Essential (primary) hypertension: Secondary | ICD-10-CM | POA: Diagnosis not present

## 2015-04-10 DIAGNOSIS — J9811 Atelectasis: Secondary | ICD-10-CM | POA: Diagnosis not present

## 2015-04-10 DIAGNOSIS — Z79899 Other long term (current) drug therapy: Secondary | ICD-10-CM | POA: Diagnosis not present

## 2015-04-14 DIAGNOSIS — S8011XA Contusion of right lower leg, initial encounter: Secondary | ICD-10-CM | POA: Diagnosis not present

## 2015-04-22 DIAGNOSIS — I82403 Acute embolism and thrombosis of unspecified deep veins of lower extremity, bilateral: Secondary | ICD-10-CM | POA: Diagnosis not present

## 2015-04-26 DIAGNOSIS — I82401 Acute embolism and thrombosis of unspecified deep veins of right lower extremity: Secondary | ICD-10-CM | POA: Diagnosis not present

## 2015-04-26 DIAGNOSIS — I10 Essential (primary) hypertension: Secondary | ICD-10-CM | POA: Diagnosis not present

## 2015-04-26 DIAGNOSIS — S8011XA Contusion of right lower leg, initial encounter: Secondary | ICD-10-CM | POA: Diagnosis not present

## 2015-04-26 DIAGNOSIS — I82402 Acute embolism and thrombosis of unspecified deep veins of left lower extremity: Secondary | ICD-10-CM | POA: Diagnosis not present

## 2015-04-30 ENCOUNTER — Telehealth: Payer: Self-pay | Admitting: Critical Care Medicine

## 2015-04-30 DIAGNOSIS — M546 Pain in thoracic spine: Secondary | ICD-10-CM | POA: Diagnosis not present

## 2015-04-30 DIAGNOSIS — S299XXA Unspecified injury of thorax, initial encounter: Secondary | ICD-10-CM | POA: Diagnosis not present

## 2015-04-30 DIAGNOSIS — I82402 Acute embolism and thrombosis of unspecified deep veins of left lower extremity: Secondary | ICD-10-CM | POA: Diagnosis not present

## 2015-04-30 DIAGNOSIS — S8012XA Contusion of left lower leg, initial encounter: Secondary | ICD-10-CM | POA: Diagnosis not present

## 2015-04-30 DIAGNOSIS — R0902 Hypoxemia: Secondary | ICD-10-CM | POA: Diagnosis not present

## 2015-04-30 DIAGNOSIS — S8011XA Contusion of right lower leg, initial encounter: Secondary | ICD-10-CM | POA: Diagnosis not present

## 2015-04-30 DIAGNOSIS — K219 Gastro-esophageal reflux disease without esophagitis: Secondary | ICD-10-CM | POA: Diagnosis not present

## 2015-04-30 DIAGNOSIS — S29012A Strain of muscle and tendon of back wall of thorax, initial encounter: Secondary | ICD-10-CM | POA: Diagnosis not present

## 2015-04-30 DIAGNOSIS — S39012A Strain of muscle, fascia and tendon of lower back, initial encounter: Secondary | ICD-10-CM | POA: Diagnosis not present

## 2015-04-30 DIAGNOSIS — S3992XA Unspecified injury of lower back, initial encounter: Secondary | ICD-10-CM | POA: Diagnosis not present

## 2015-04-30 DIAGNOSIS — I82401 Acute embolism and thrombosis of unspecified deep veins of right lower extremity: Secondary | ICD-10-CM | POA: Diagnosis not present

## 2015-04-30 DIAGNOSIS — I10 Essential (primary) hypertension: Secondary | ICD-10-CM | POA: Diagnosis not present

## 2015-04-30 DIAGNOSIS — M545 Low back pain: Secondary | ICD-10-CM | POA: Diagnosis not present

## 2015-04-30 NOTE — Telephone Encounter (Signed)
lmomtcb x1 

## 2015-04-30 NOTE — Telephone Encounter (Signed)
Patient calling- wanting to know why she has to use the [employer] rather than using he inhaler as intended.  I explained to the patient that often time when hand pump/spray inhalers the medication will either hit the cheeks or the back of the throat and will not fully be inhaled into the lungs.  The [employer] ensures that the full dose of medication is inhaled into the lungs - also reduces the rick of throat irritation. Pt expressed understanding. Nothing further needed.

## 2015-05-02 ENCOUNTER — Ambulatory Visit: Payer: Medicare Other

## 2015-05-02 DIAGNOSIS — I82401 Acute embolism and thrombosis of unspecified deep veins of right lower extremity: Secondary | ICD-10-CM | POA: Diagnosis not present

## 2015-05-02 DIAGNOSIS — I10 Essential (primary) hypertension: Secondary | ICD-10-CM | POA: Diagnosis not present

## 2015-05-02 DIAGNOSIS — S8011XA Contusion of right lower leg, initial encounter: Secondary | ICD-10-CM | POA: Diagnosis not present

## 2015-05-02 DIAGNOSIS — I82402 Acute embolism and thrombosis of unspecified deep veins of left lower extremity: Secondary | ICD-10-CM | POA: Diagnosis not present

## 2015-05-03 DIAGNOSIS — I10 Essential (primary) hypertension: Secondary | ICD-10-CM | POA: Diagnosis not present

## 2015-05-03 DIAGNOSIS — R5383 Other fatigue: Secondary | ICD-10-CM | POA: Diagnosis not present

## 2015-05-03 DIAGNOSIS — I80203 Phlebitis and thrombophlebitis of unspecified deep vessels of lower extremities, bilateral: Secondary | ICD-10-CM | POA: Diagnosis not present

## 2015-05-03 DIAGNOSIS — B370 Candidal stomatitis: Secondary | ICD-10-CM | POA: Diagnosis not present

## 2015-05-07 DIAGNOSIS — I82401 Acute embolism and thrombosis of unspecified deep veins of right lower extremity: Secondary | ICD-10-CM | POA: Diagnosis not present

## 2015-05-07 DIAGNOSIS — S8011XA Contusion of right lower leg, initial encounter: Secondary | ICD-10-CM | POA: Diagnosis not present

## 2015-05-07 DIAGNOSIS — I82402 Acute embolism and thrombosis of unspecified deep veins of left lower extremity: Secondary | ICD-10-CM | POA: Diagnosis not present

## 2015-05-07 DIAGNOSIS — I10 Essential (primary) hypertension: Secondary | ICD-10-CM | POA: Diagnosis not present

## 2015-05-09 DIAGNOSIS — I82402 Acute embolism and thrombosis of unspecified deep veins of left lower extremity: Secondary | ICD-10-CM | POA: Diagnosis not present

## 2015-05-09 DIAGNOSIS — I82401 Acute embolism and thrombosis of unspecified deep veins of right lower extremity: Secondary | ICD-10-CM | POA: Diagnosis not present

## 2015-05-09 DIAGNOSIS — I10 Essential (primary) hypertension: Secondary | ICD-10-CM | POA: Diagnosis not present

## 2015-05-09 DIAGNOSIS — S8011XA Contusion of right lower leg, initial encounter: Secondary | ICD-10-CM | POA: Diagnosis not present

## 2015-05-10 DIAGNOSIS — I10 Essential (primary) hypertension: Secondary | ICD-10-CM | POA: Diagnosis not present

## 2015-05-10 DIAGNOSIS — I82402 Acute embolism and thrombosis of unspecified deep veins of left lower extremity: Secondary | ICD-10-CM | POA: Diagnosis not present

## 2015-05-10 DIAGNOSIS — I82401 Acute embolism and thrombosis of unspecified deep veins of right lower extremity: Secondary | ICD-10-CM | POA: Diagnosis not present

## 2015-05-10 DIAGNOSIS — S8011XA Contusion of right lower leg, initial encounter: Secondary | ICD-10-CM | POA: Diagnosis not present

## 2015-05-14 DIAGNOSIS — I82402 Acute embolism and thrombosis of unspecified deep veins of left lower extremity: Secondary | ICD-10-CM | POA: Diagnosis not present

## 2015-05-14 DIAGNOSIS — I82401 Acute embolism and thrombosis of unspecified deep veins of right lower extremity: Secondary | ICD-10-CM | POA: Diagnosis not present

## 2015-05-14 DIAGNOSIS — S8011XA Contusion of right lower leg, initial encounter: Secondary | ICD-10-CM | POA: Diagnosis not present

## 2015-05-14 DIAGNOSIS — I10 Essential (primary) hypertension: Secondary | ICD-10-CM | POA: Diagnosis not present

## 2015-05-22 DIAGNOSIS — I82401 Acute embolism and thrombosis of unspecified deep veins of right lower extremity: Secondary | ICD-10-CM | POA: Diagnosis not present

## 2015-05-22 DIAGNOSIS — I82402 Acute embolism and thrombosis of unspecified deep veins of left lower extremity: Secondary | ICD-10-CM | POA: Diagnosis not present

## 2015-05-22 DIAGNOSIS — I10 Essential (primary) hypertension: Secondary | ICD-10-CM | POA: Diagnosis not present

## 2015-05-22 DIAGNOSIS — S8011XA Contusion of right lower leg, initial encounter: Secondary | ICD-10-CM | POA: Diagnosis not present

## 2015-05-23 DIAGNOSIS — R51 Headache: Secondary | ICD-10-CM | POA: Diagnosis not present

## 2015-05-23 DIAGNOSIS — E785 Hyperlipidemia, unspecified: Secondary | ICD-10-CM | POA: Diagnosis not present

## 2015-05-23 DIAGNOSIS — I10 Essential (primary) hypertension: Secondary | ICD-10-CM | POA: Diagnosis not present

## 2015-05-23 DIAGNOSIS — Z79899 Other long term (current) drug therapy: Secondary | ICD-10-CM | POA: Diagnosis not present

## 2015-05-23 DIAGNOSIS — R251 Tremor, unspecified: Secondary | ICD-10-CM | POA: Diagnosis not present

## 2015-05-29 DIAGNOSIS — R51 Headache: Secondary | ICD-10-CM | POA: Diagnosis not present

## 2015-05-30 DIAGNOSIS — I82401 Acute embolism and thrombosis of unspecified deep veins of right lower extremity: Secondary | ICD-10-CM | POA: Diagnosis not present

## 2015-05-30 DIAGNOSIS — I10 Essential (primary) hypertension: Secondary | ICD-10-CM | POA: Diagnosis not present

## 2015-05-30 DIAGNOSIS — S8011XA Contusion of right lower leg, initial encounter: Secondary | ICD-10-CM | POA: Diagnosis not present

## 2015-05-30 DIAGNOSIS — I82402 Acute embolism and thrombosis of unspecified deep veins of left lower extremity: Secondary | ICD-10-CM | POA: Diagnosis not present

## 2015-05-31 DIAGNOSIS — L03115 Cellulitis of right lower limb: Secondary | ICD-10-CM | POA: Diagnosis not present

## 2015-05-31 DIAGNOSIS — M79604 Pain in right leg: Secondary | ICD-10-CM | POA: Diagnosis not present

## 2015-05-31 DIAGNOSIS — M7989 Other specified soft tissue disorders: Secondary | ICD-10-CM | POA: Diagnosis not present

## 2015-06-03 DIAGNOSIS — M79604 Pain in right leg: Secondary | ICD-10-CM | POA: Diagnosis not present

## 2015-06-04 DIAGNOSIS — I10 Essential (primary) hypertension: Secondary | ICD-10-CM | POA: Diagnosis not present

## 2015-06-04 DIAGNOSIS — I82402 Acute embolism and thrombosis of unspecified deep veins of left lower extremity: Secondary | ICD-10-CM | POA: Diagnosis not present

## 2015-06-04 DIAGNOSIS — S8011XA Contusion of right lower leg, initial encounter: Secondary | ICD-10-CM | POA: Diagnosis not present

## 2015-06-04 DIAGNOSIS — I82401 Acute embolism and thrombosis of unspecified deep veins of right lower extremity: Secondary | ICD-10-CM | POA: Diagnosis not present

## 2015-06-06 DIAGNOSIS — M7062 Trochanteric bursitis, left hip: Secondary | ICD-10-CM | POA: Diagnosis not present

## 2015-06-06 DIAGNOSIS — S76012A Strain of muscle, fascia and tendon of left hip, initial encounter: Secondary | ICD-10-CM | POA: Diagnosis not present

## 2015-06-10 DIAGNOSIS — M7989 Other specified soft tissue disorders: Secondary | ICD-10-CM | POA: Diagnosis not present

## 2015-06-10 DIAGNOSIS — M79604 Pain in right leg: Secondary | ICD-10-CM | POA: Diagnosis not present

## 2015-06-10 DIAGNOSIS — I10 Essential (primary) hypertension: Secondary | ICD-10-CM | POA: Diagnosis not present

## 2015-06-10 DIAGNOSIS — S8011XA Contusion of right lower leg, initial encounter: Secondary | ICD-10-CM | POA: Diagnosis not present

## 2015-06-11 DIAGNOSIS — S8011XA Contusion of right lower leg, initial encounter: Secondary | ICD-10-CM | POA: Diagnosis not present

## 2015-06-11 DIAGNOSIS — I82402 Acute embolism and thrombosis of unspecified deep veins of left lower extremity: Secondary | ICD-10-CM | POA: Diagnosis not present

## 2015-06-11 DIAGNOSIS — I82401 Acute embolism and thrombosis of unspecified deep veins of right lower extremity: Secondary | ICD-10-CM | POA: Diagnosis not present

## 2015-06-11 DIAGNOSIS — I10 Essential (primary) hypertension: Secondary | ICD-10-CM | POA: Diagnosis not present

## 2015-06-12 DIAGNOSIS — M79604 Pain in right leg: Secondary | ICD-10-CM | POA: Diagnosis not present

## 2015-06-12 DIAGNOSIS — S8991XA Unspecified injury of right lower leg, initial encounter: Secondary | ICD-10-CM | POA: Diagnosis not present

## 2015-06-12 DIAGNOSIS — E860 Dehydration: Secondary | ICD-10-CM | POA: Diagnosis not present

## 2015-06-12 DIAGNOSIS — Z888 Allergy status to other drugs, medicaments and biological substances status: Secondary | ICD-10-CM | POA: Diagnosis not present

## 2015-06-12 DIAGNOSIS — S299XXA Unspecified injury of thorax, initial encounter: Secondary | ICD-10-CM | POA: Diagnosis not present

## 2015-06-12 DIAGNOSIS — Z86718 Personal history of other venous thrombosis and embolism: Secondary | ICD-10-CM | POA: Diagnosis not present

## 2015-06-12 DIAGNOSIS — Z23 Encounter for immunization: Secondary | ICD-10-CM | POA: Diagnosis not present

## 2015-06-12 DIAGNOSIS — Z885 Allergy status to narcotic agent status: Secondary | ICD-10-CM | POA: Diagnosis not present

## 2015-06-12 DIAGNOSIS — Z7982 Long term (current) use of aspirin: Secondary | ICD-10-CM | POA: Diagnosis not present

## 2015-06-12 DIAGNOSIS — M79661 Pain in right lower leg: Secondary | ICD-10-CM | POA: Diagnosis not present

## 2015-06-12 DIAGNOSIS — E780 Pure hypercholesterolemia: Secondary | ICD-10-CM | POA: Diagnosis present

## 2015-06-12 DIAGNOSIS — R000 Tachycardia, unspecified: Secondary | ICD-10-CM | POA: Diagnosis not present

## 2015-06-12 DIAGNOSIS — J9811 Atelectasis: Secondary | ICD-10-CM | POA: Diagnosis not present

## 2015-06-12 DIAGNOSIS — E0590 Thyrotoxicosis, unspecified without thyrotoxic crisis or storm: Secondary | ICD-10-CM | POA: Diagnosis not present

## 2015-06-12 DIAGNOSIS — I493 Ventricular premature depolarization: Secondary | ICD-10-CM | POA: Diagnosis not present

## 2015-06-12 DIAGNOSIS — I10 Essential (primary) hypertension: Secondary | ICD-10-CM | POA: Diagnosis not present

## 2015-06-12 DIAGNOSIS — M7989 Other specified soft tissue disorders: Secondary | ICD-10-CM | POA: Diagnosis not present

## 2015-06-12 DIAGNOSIS — K219 Gastro-esophageal reflux disease without esophagitis: Secondary | ICD-10-CM | POA: Diagnosis present

## 2015-06-12 DIAGNOSIS — Z882 Allergy status to sulfonamides status: Secondary | ICD-10-CM | POA: Diagnosis not present

## 2015-06-12 DIAGNOSIS — Z79899 Other long term (current) drug therapy: Secondary | ICD-10-CM | POA: Diagnosis not present

## 2015-06-12 DIAGNOSIS — L03115 Cellulitis of right lower limb: Secondary | ICD-10-CM | POA: Diagnosis not present

## 2015-06-19 DIAGNOSIS — Z7982 Long term (current) use of aspirin: Secondary | ICD-10-CM | POA: Diagnosis not present

## 2015-06-19 DIAGNOSIS — Z86718 Personal history of other venous thrombosis and embolism: Secondary | ICD-10-CM | POA: Diagnosis not present

## 2015-06-19 DIAGNOSIS — R069 Unspecified abnormalities of breathing: Secondary | ICD-10-CM | POA: Diagnosis not present

## 2015-06-19 DIAGNOSIS — R531 Weakness: Secondary | ICD-10-CM | POA: Diagnosis not present

## 2015-06-19 DIAGNOSIS — I10 Essential (primary) hypertension: Secondary | ICD-10-CM | POA: Diagnosis not present

## 2015-06-19 DIAGNOSIS — Z9181 History of falling: Secondary | ICD-10-CM | POA: Diagnosis not present

## 2015-06-19 DIAGNOSIS — S8011XD Contusion of right lower leg, subsequent encounter: Secondary | ICD-10-CM | POA: Diagnosis not present

## 2015-06-19 DIAGNOSIS — E039 Hypothyroidism, unspecified: Secondary | ICD-10-CM | POA: Diagnosis not present

## 2015-06-19 DIAGNOSIS — R630 Anorexia: Secondary | ICD-10-CM | POA: Diagnosis not present

## 2015-06-19 DIAGNOSIS — R0602 Shortness of breath: Secondary | ICD-10-CM | POA: Diagnosis not present

## 2015-06-19 DIAGNOSIS — J90 Pleural effusion, not elsewhere classified: Secondary | ICD-10-CM | POA: Diagnosis not present

## 2015-06-19 DIAGNOSIS — R110 Nausea: Secondary | ICD-10-CM | POA: Diagnosis not present

## 2015-06-20 DIAGNOSIS — R110 Nausea: Secondary | ICD-10-CM | POA: Diagnosis not present

## 2015-06-20 DIAGNOSIS — I10 Essential (primary) hypertension: Secondary | ICD-10-CM | POA: Diagnosis not present

## 2015-06-20 DIAGNOSIS — E039 Hypothyroidism, unspecified: Secondary | ICD-10-CM | POA: Diagnosis not present

## 2015-06-20 DIAGNOSIS — R630 Anorexia: Secondary | ICD-10-CM | POA: Diagnosis not present

## 2015-06-21 DIAGNOSIS — I82401 Acute embolism and thrombosis of unspecified deep veins of right lower extremity: Secondary | ICD-10-CM | POA: Diagnosis not present

## 2015-06-21 DIAGNOSIS — I10 Essential (primary) hypertension: Secondary | ICD-10-CM | POA: Diagnosis not present

## 2015-06-21 DIAGNOSIS — I82402 Acute embolism and thrombosis of unspecified deep veins of left lower extremity: Secondary | ICD-10-CM | POA: Diagnosis not present

## 2015-06-21 DIAGNOSIS — S8011XA Contusion of right lower leg, initial encounter: Secondary | ICD-10-CM | POA: Diagnosis not present

## 2015-06-24 DIAGNOSIS — I82402 Acute embolism and thrombosis of unspecified deep veins of left lower extremity: Secondary | ICD-10-CM | POA: Diagnosis not present

## 2015-06-24 DIAGNOSIS — I82401 Acute embolism and thrombosis of unspecified deep veins of right lower extremity: Secondary | ICD-10-CM | POA: Diagnosis not present

## 2015-06-24 DIAGNOSIS — I10 Essential (primary) hypertension: Secondary | ICD-10-CM | POA: Diagnosis not present

## 2015-06-24 DIAGNOSIS — S8011XA Contusion of right lower leg, initial encounter: Secondary | ICD-10-CM | POA: Diagnosis not present

## 2015-06-25 DIAGNOSIS — K219 Gastro-esophageal reflux disease without esophagitis: Secondary | ICD-10-CM | POA: Diagnosis not present

## 2015-06-25 DIAGNOSIS — Z86718 Personal history of other venous thrombosis and embolism: Secondary | ICD-10-CM | POA: Diagnosis not present

## 2015-06-25 DIAGNOSIS — Z9181 History of falling: Secondary | ICD-10-CM | POA: Diagnosis not present

## 2015-06-25 DIAGNOSIS — Z7982 Long term (current) use of aspirin: Secondary | ICD-10-CM | POA: Diagnosis not present

## 2015-06-25 DIAGNOSIS — S8011XD Contusion of right lower leg, subsequent encounter: Secondary | ICD-10-CM | POA: Diagnosis not present

## 2015-06-25 DIAGNOSIS — I10 Essential (primary) hypertension: Secondary | ICD-10-CM | POA: Diagnosis not present

## 2015-06-26 DIAGNOSIS — Z7982 Long term (current) use of aspirin: Secondary | ICD-10-CM | POA: Diagnosis not present

## 2015-06-26 DIAGNOSIS — K219 Gastro-esophageal reflux disease without esophagitis: Secondary | ICD-10-CM | POA: Diagnosis not present

## 2015-06-26 DIAGNOSIS — Z86718 Personal history of other venous thrombosis and embolism: Secondary | ICD-10-CM | POA: Diagnosis not present

## 2015-06-26 DIAGNOSIS — S8011XD Contusion of right lower leg, subsequent encounter: Secondary | ICD-10-CM | POA: Diagnosis not present

## 2015-06-26 DIAGNOSIS — Z9181 History of falling: Secondary | ICD-10-CM | POA: Diagnosis not present

## 2015-06-26 DIAGNOSIS — I10 Essential (primary) hypertension: Secondary | ICD-10-CM | POA: Diagnosis not present

## 2015-06-27 DIAGNOSIS — R531 Weakness: Secondary | ICD-10-CM | POA: Diagnosis not present

## 2015-06-27 DIAGNOSIS — I10 Essential (primary) hypertension: Secondary | ICD-10-CM | POA: Diagnosis not present

## 2015-06-27 DIAGNOSIS — R5383 Other fatigue: Secondary | ICD-10-CM | POA: Diagnosis not present

## 2015-06-27 DIAGNOSIS — G4700 Insomnia, unspecified: Secondary | ICD-10-CM | POA: Diagnosis not present

## 2015-06-27 DIAGNOSIS — E039 Hypothyroidism, unspecified: Secondary | ICD-10-CM | POA: Diagnosis not present

## 2015-06-28 DIAGNOSIS — I10 Essential (primary) hypertension: Secondary | ICD-10-CM | POA: Diagnosis not present

## 2015-06-28 DIAGNOSIS — Z7982 Long term (current) use of aspirin: Secondary | ICD-10-CM | POA: Diagnosis not present

## 2015-06-28 DIAGNOSIS — K219 Gastro-esophageal reflux disease without esophagitis: Secondary | ICD-10-CM | POA: Diagnosis not present

## 2015-06-28 DIAGNOSIS — S8011XD Contusion of right lower leg, subsequent encounter: Secondary | ICD-10-CM | POA: Diagnosis not present

## 2015-06-28 DIAGNOSIS — Z9181 History of falling: Secondary | ICD-10-CM | POA: Diagnosis not present

## 2015-06-28 DIAGNOSIS — Z86718 Personal history of other venous thrombosis and embolism: Secondary | ICD-10-CM | POA: Diagnosis not present

## 2015-07-01 DIAGNOSIS — K219 Gastro-esophageal reflux disease without esophagitis: Secondary | ICD-10-CM | POA: Diagnosis not present

## 2015-07-01 DIAGNOSIS — Z86718 Personal history of other venous thrombosis and embolism: Secondary | ICD-10-CM | POA: Diagnosis not present

## 2015-07-01 DIAGNOSIS — Z9181 History of falling: Secondary | ICD-10-CM | POA: Diagnosis not present

## 2015-07-01 DIAGNOSIS — S8011XD Contusion of right lower leg, subsequent encounter: Secondary | ICD-10-CM | POA: Diagnosis not present

## 2015-07-01 DIAGNOSIS — Z7982 Long term (current) use of aspirin: Secondary | ICD-10-CM | POA: Diagnosis not present

## 2015-07-01 DIAGNOSIS — I10 Essential (primary) hypertension: Secondary | ICD-10-CM | POA: Diagnosis not present

## 2015-07-03 DIAGNOSIS — Z7982 Long term (current) use of aspirin: Secondary | ICD-10-CM | POA: Diagnosis not present

## 2015-07-03 DIAGNOSIS — Z9181 History of falling: Secondary | ICD-10-CM | POA: Diagnosis not present

## 2015-07-03 DIAGNOSIS — K219 Gastro-esophageal reflux disease without esophagitis: Secondary | ICD-10-CM | POA: Diagnosis not present

## 2015-07-03 DIAGNOSIS — S8011XD Contusion of right lower leg, subsequent encounter: Secondary | ICD-10-CM | POA: Diagnosis not present

## 2015-07-03 DIAGNOSIS — I10 Essential (primary) hypertension: Secondary | ICD-10-CM | POA: Diagnosis not present

## 2015-07-03 DIAGNOSIS — Z86718 Personal history of other venous thrombosis and embolism: Secondary | ICD-10-CM | POA: Diagnosis not present

## 2015-07-05 DIAGNOSIS — Z9181 History of falling: Secondary | ICD-10-CM | POA: Diagnosis not present

## 2015-07-05 DIAGNOSIS — K219 Gastro-esophageal reflux disease without esophagitis: Secondary | ICD-10-CM | POA: Diagnosis not present

## 2015-07-05 DIAGNOSIS — Z86718 Personal history of other venous thrombosis and embolism: Secondary | ICD-10-CM | POA: Diagnosis not present

## 2015-07-05 DIAGNOSIS — I10 Essential (primary) hypertension: Secondary | ICD-10-CM | POA: Diagnosis not present

## 2015-07-05 DIAGNOSIS — Z7982 Long term (current) use of aspirin: Secondary | ICD-10-CM | POA: Diagnosis not present

## 2015-07-05 DIAGNOSIS — S8011XD Contusion of right lower leg, subsequent encounter: Secondary | ICD-10-CM | POA: Diagnosis not present

## 2015-07-08 DIAGNOSIS — K219 Gastro-esophageal reflux disease without esophagitis: Secondary | ICD-10-CM | POA: Diagnosis not present

## 2015-07-08 DIAGNOSIS — Z86718 Personal history of other venous thrombosis and embolism: Secondary | ICD-10-CM | POA: Diagnosis not present

## 2015-07-08 DIAGNOSIS — I10 Essential (primary) hypertension: Secondary | ICD-10-CM | POA: Diagnosis not present

## 2015-07-08 DIAGNOSIS — S8011XD Contusion of right lower leg, subsequent encounter: Secondary | ICD-10-CM | POA: Diagnosis not present

## 2015-07-08 DIAGNOSIS — Z9181 History of falling: Secondary | ICD-10-CM | POA: Diagnosis not present

## 2015-07-08 DIAGNOSIS — Z7982 Long term (current) use of aspirin: Secondary | ICD-10-CM | POA: Diagnosis not present

## 2015-07-09 DIAGNOSIS — Z7982 Long term (current) use of aspirin: Secondary | ICD-10-CM | POA: Diagnosis not present

## 2015-07-09 DIAGNOSIS — S8011XD Contusion of right lower leg, subsequent encounter: Secondary | ICD-10-CM | POA: Diagnosis not present

## 2015-07-09 DIAGNOSIS — I10 Essential (primary) hypertension: Secondary | ICD-10-CM | POA: Diagnosis not present

## 2015-07-09 DIAGNOSIS — Z86718 Personal history of other venous thrombosis and embolism: Secondary | ICD-10-CM | POA: Diagnosis not present

## 2015-07-09 DIAGNOSIS — K219 Gastro-esophageal reflux disease without esophagitis: Secondary | ICD-10-CM | POA: Diagnosis not present

## 2015-07-09 DIAGNOSIS — Z9181 History of falling: Secondary | ICD-10-CM | POA: Diagnosis not present

## 2015-07-10 DIAGNOSIS — Z9181 History of falling: Secondary | ICD-10-CM | POA: Diagnosis not present

## 2015-07-10 DIAGNOSIS — S8011XD Contusion of right lower leg, subsequent encounter: Secondary | ICD-10-CM | POA: Diagnosis not present

## 2015-07-10 DIAGNOSIS — Z86718 Personal history of other venous thrombosis and embolism: Secondary | ICD-10-CM | POA: Diagnosis not present

## 2015-07-10 DIAGNOSIS — Z7982 Long term (current) use of aspirin: Secondary | ICD-10-CM | POA: Diagnosis not present

## 2015-07-10 DIAGNOSIS — K219 Gastro-esophageal reflux disease without esophagitis: Secondary | ICD-10-CM | POA: Diagnosis not present

## 2015-07-10 DIAGNOSIS — I10 Essential (primary) hypertension: Secondary | ICD-10-CM | POA: Diagnosis not present

## 2015-07-11 DIAGNOSIS — Z86718 Personal history of other venous thrombosis and embolism: Secondary | ICD-10-CM | POA: Diagnosis not present

## 2015-07-11 DIAGNOSIS — S8011XD Contusion of right lower leg, subsequent encounter: Secondary | ICD-10-CM | POA: Diagnosis not present

## 2015-07-11 DIAGNOSIS — Z9181 History of falling: Secondary | ICD-10-CM | POA: Diagnosis not present

## 2015-07-11 DIAGNOSIS — K219 Gastro-esophageal reflux disease without esophagitis: Secondary | ICD-10-CM | POA: Diagnosis not present

## 2015-07-11 DIAGNOSIS — Z7982 Long term (current) use of aspirin: Secondary | ICD-10-CM | POA: Diagnosis not present

## 2015-07-11 DIAGNOSIS — I10 Essential (primary) hypertension: Secondary | ICD-10-CM | POA: Diagnosis not present

## 2015-07-15 DIAGNOSIS — K219 Gastro-esophageal reflux disease without esophagitis: Secondary | ICD-10-CM | POA: Diagnosis not present

## 2015-07-15 DIAGNOSIS — I10 Essential (primary) hypertension: Secondary | ICD-10-CM | POA: Diagnosis not present

## 2015-07-15 DIAGNOSIS — S8011XD Contusion of right lower leg, subsequent encounter: Secondary | ICD-10-CM | POA: Diagnosis not present

## 2015-07-15 DIAGNOSIS — Z9181 History of falling: Secondary | ICD-10-CM | POA: Diagnosis not present

## 2015-07-15 DIAGNOSIS — Z86718 Personal history of other venous thrombosis and embolism: Secondary | ICD-10-CM | POA: Diagnosis not present

## 2015-07-15 DIAGNOSIS — Z7982 Long term (current) use of aspirin: Secondary | ICD-10-CM | POA: Diagnosis not present

## 2015-07-18 DIAGNOSIS — S76012A Strain of muscle, fascia and tendon of left hip, initial encounter: Secondary | ICD-10-CM | POA: Diagnosis not present

## 2015-07-19 DIAGNOSIS — Z9181 History of falling: Secondary | ICD-10-CM | POA: Diagnosis not present

## 2015-07-19 DIAGNOSIS — I10 Essential (primary) hypertension: Secondary | ICD-10-CM | POA: Diagnosis not present

## 2015-07-19 DIAGNOSIS — K219 Gastro-esophageal reflux disease without esophagitis: Secondary | ICD-10-CM | POA: Diagnosis not present

## 2015-07-19 DIAGNOSIS — Z7982 Long term (current) use of aspirin: Secondary | ICD-10-CM | POA: Diagnosis not present

## 2015-07-19 DIAGNOSIS — S8011XD Contusion of right lower leg, subsequent encounter: Secondary | ICD-10-CM | POA: Diagnosis not present

## 2015-07-19 DIAGNOSIS — Z86718 Personal history of other venous thrombosis and embolism: Secondary | ICD-10-CM | POA: Diagnosis not present

## 2015-07-25 DIAGNOSIS — Z79899 Other long term (current) drug therapy: Secondary | ICD-10-CM | POA: Diagnosis not present

## 2015-07-25 DIAGNOSIS — M79606 Pain in leg, unspecified: Secondary | ICD-10-CM | POA: Diagnosis not present

## 2015-07-25 DIAGNOSIS — E039 Hypothyroidism, unspecified: Secondary | ICD-10-CM | POA: Diagnosis not present

## 2015-07-29 DIAGNOSIS — I10 Essential (primary) hypertension: Secondary | ICD-10-CM | POA: Diagnosis not present

## 2015-07-29 DIAGNOSIS — Z7982 Long term (current) use of aspirin: Secondary | ICD-10-CM | POA: Diagnosis not present

## 2015-07-29 DIAGNOSIS — Z86718 Personal history of other venous thrombosis and embolism: Secondary | ICD-10-CM | POA: Diagnosis not present

## 2015-07-29 DIAGNOSIS — S8011XD Contusion of right lower leg, subsequent encounter: Secondary | ICD-10-CM | POA: Diagnosis not present

## 2015-07-29 DIAGNOSIS — Z9181 History of falling: Secondary | ICD-10-CM | POA: Diagnosis not present

## 2015-08-01 DIAGNOSIS — M79604 Pain in right leg: Secondary | ICD-10-CM | POA: Diagnosis not present

## 2015-08-01 DIAGNOSIS — Z79899 Other long term (current) drug therapy: Secondary | ICD-10-CM | POA: Diagnosis not present

## 2015-08-01 DIAGNOSIS — M7989 Other specified soft tissue disorders: Secondary | ICD-10-CM | POA: Diagnosis not present

## 2015-08-01 DIAGNOSIS — M79605 Pain in left leg: Secondary | ICD-10-CM | POA: Diagnosis not present

## 2015-08-12 DIAGNOSIS — M7989 Other specified soft tissue disorders: Secondary | ICD-10-CM | POA: Diagnosis not present

## 2015-08-13 DIAGNOSIS — I10 Essential (primary) hypertension: Secondary | ICD-10-CM | POA: Diagnosis not present

## 2015-08-13 DIAGNOSIS — R630 Anorexia: Secondary | ICD-10-CM | POA: Diagnosis not present

## 2015-08-13 DIAGNOSIS — E039 Hypothyroidism, unspecified: Secondary | ICD-10-CM | POA: Diagnosis not present

## 2015-08-13 DIAGNOSIS — R05 Cough: Secondary | ICD-10-CM | POA: Diagnosis not present

## 2015-08-13 DIAGNOSIS — K5900 Constipation, unspecified: Secondary | ICD-10-CM | POA: Diagnosis not present

## 2015-08-15 DIAGNOSIS — M6281 Muscle weakness (generalized): Secondary | ICD-10-CM | POA: Diagnosis not present

## 2015-08-15 DIAGNOSIS — R531 Weakness: Secondary | ICD-10-CM | POA: Diagnosis not present

## 2015-08-15 DIAGNOSIS — M79606 Pain in leg, unspecified: Secondary | ICD-10-CM | POA: Diagnosis not present

## 2015-08-15 DIAGNOSIS — R262 Difficulty in walking, not elsewhere classified: Secondary | ICD-10-CM | POA: Diagnosis not present

## 2015-08-20 DIAGNOSIS — M6281 Muscle weakness (generalized): Secondary | ICD-10-CM | POA: Diagnosis not present

## 2015-08-20 DIAGNOSIS — M79606 Pain in leg, unspecified: Secondary | ICD-10-CM | POA: Diagnosis not present

## 2015-08-20 DIAGNOSIS — R531 Weakness: Secondary | ICD-10-CM | POA: Diagnosis not present

## 2015-08-20 DIAGNOSIS — R262 Difficulty in walking, not elsewhere classified: Secondary | ICD-10-CM | POA: Diagnosis not present

## 2015-08-27 DIAGNOSIS — M6281 Muscle weakness (generalized): Secondary | ICD-10-CM | POA: Diagnosis not present

## 2015-08-27 DIAGNOSIS — R262 Difficulty in walking, not elsewhere classified: Secondary | ICD-10-CM | POA: Diagnosis not present

## 2015-08-27 DIAGNOSIS — R531 Weakness: Secondary | ICD-10-CM | POA: Diagnosis not present

## 2015-08-27 DIAGNOSIS — M79606 Pain in leg, unspecified: Secondary | ICD-10-CM | POA: Diagnosis not present

## 2015-08-29 DIAGNOSIS — R262 Difficulty in walking, not elsewhere classified: Secondary | ICD-10-CM | POA: Diagnosis not present

## 2015-08-29 DIAGNOSIS — M6281 Muscle weakness (generalized): Secondary | ICD-10-CM | POA: Diagnosis not present

## 2015-08-29 DIAGNOSIS — R531 Weakness: Secondary | ICD-10-CM | POA: Diagnosis not present

## 2015-08-29 DIAGNOSIS — M79606 Pain in leg, unspecified: Secondary | ICD-10-CM | POA: Diagnosis not present

## 2015-09-12 DIAGNOSIS — M79606 Pain in leg, unspecified: Secondary | ICD-10-CM | POA: Diagnosis not present

## 2015-09-12 DIAGNOSIS — M6281 Muscle weakness (generalized): Secondary | ICD-10-CM | POA: Diagnosis not present

## 2015-09-12 DIAGNOSIS — R531 Weakness: Secondary | ICD-10-CM | POA: Diagnosis not present

## 2015-09-12 DIAGNOSIS — R262 Difficulty in walking, not elsewhere classified: Secondary | ICD-10-CM | POA: Diagnosis not present

## 2015-09-20 DIAGNOSIS — Z4789 Encounter for other orthopedic aftercare: Secondary | ICD-10-CM | POA: Diagnosis not present

## 2015-09-20 DIAGNOSIS — Z96651 Presence of right artificial knee joint: Secondary | ICD-10-CM | POA: Diagnosis not present

## 2015-09-20 DIAGNOSIS — Z471 Aftercare following joint replacement surgery: Secondary | ICD-10-CM | POA: Diagnosis not present

## 2015-09-24 DIAGNOSIS — E785 Hyperlipidemia, unspecified: Secondary | ICD-10-CM | POA: Diagnosis not present

## 2015-09-24 DIAGNOSIS — R630 Anorexia: Secondary | ICD-10-CM | POA: Diagnosis not present

## 2015-09-24 DIAGNOSIS — L989 Disorder of the skin and subcutaneous tissue, unspecified: Secondary | ICD-10-CM | POA: Diagnosis not present

## 2015-09-24 DIAGNOSIS — I10 Essential (primary) hypertension: Secondary | ICD-10-CM | POA: Diagnosis not present

## 2015-09-24 DIAGNOSIS — G603 Idiopathic progressive neuropathy: Secondary | ICD-10-CM | POA: Diagnosis not present

## 2015-09-24 DIAGNOSIS — E039 Hypothyroidism, unspecified: Secondary | ICD-10-CM | POA: Diagnosis not present

## 2015-09-24 DIAGNOSIS — M47816 Spondylosis without myelopathy or radiculopathy, lumbar region: Secondary | ICD-10-CM | POA: Diagnosis not present

## 2015-09-26 DIAGNOSIS — R262 Difficulty in walking, not elsewhere classified: Secondary | ICD-10-CM | POA: Diagnosis not present

## 2015-09-26 DIAGNOSIS — Z79899 Other long term (current) drug therapy: Secondary | ICD-10-CM | POA: Diagnosis not present

## 2015-09-26 DIAGNOSIS — M6281 Muscle weakness (generalized): Secondary | ICD-10-CM | POA: Diagnosis not present

## 2015-09-26 DIAGNOSIS — E785 Hyperlipidemia, unspecified: Secondary | ICD-10-CM | POA: Diagnosis not present

## 2015-09-26 DIAGNOSIS — I10 Essential (primary) hypertension: Secondary | ICD-10-CM | POA: Diagnosis not present

## 2015-09-26 DIAGNOSIS — E039 Hypothyroidism, unspecified: Secondary | ICD-10-CM | POA: Diagnosis not present

## 2015-09-26 DIAGNOSIS — M79606 Pain in leg, unspecified: Secondary | ICD-10-CM | POA: Diagnosis not present

## 2015-09-26 DIAGNOSIS — R531 Weakness: Secondary | ICD-10-CM | POA: Diagnosis not present

## 2015-10-01 DIAGNOSIS — M6281 Muscle weakness (generalized): Secondary | ICD-10-CM | POA: Diagnosis not present

## 2015-10-01 DIAGNOSIS — R531 Weakness: Secondary | ICD-10-CM | POA: Diagnosis not present

## 2015-10-01 DIAGNOSIS — M79606 Pain in leg, unspecified: Secondary | ICD-10-CM | POA: Diagnosis not present

## 2015-10-01 DIAGNOSIS — R262 Difficulty in walking, not elsewhere classified: Secondary | ICD-10-CM | POA: Diagnosis not present

## 2015-10-08 DIAGNOSIS — M79604 Pain in right leg: Secondary | ICD-10-CM | POA: Diagnosis not present

## 2015-10-15 DIAGNOSIS — M6281 Muscle weakness (generalized): Secondary | ICD-10-CM | POA: Diagnosis not present

## 2015-10-15 DIAGNOSIS — R531 Weakness: Secondary | ICD-10-CM | POA: Diagnosis not present

## 2015-10-15 DIAGNOSIS — M79606 Pain in leg, unspecified: Secondary | ICD-10-CM | POA: Diagnosis not present

## 2015-10-15 DIAGNOSIS — R262 Difficulty in walking, not elsewhere classified: Secondary | ICD-10-CM | POA: Diagnosis not present

## 2015-10-17 DIAGNOSIS — M79606 Pain in leg, unspecified: Secondary | ICD-10-CM | POA: Diagnosis not present

## 2015-10-17 DIAGNOSIS — M6281 Muscle weakness (generalized): Secondary | ICD-10-CM | POA: Diagnosis not present

## 2015-10-17 DIAGNOSIS — R531 Weakness: Secondary | ICD-10-CM | POA: Diagnosis not present

## 2015-10-17 DIAGNOSIS — R262 Difficulty in walking, not elsewhere classified: Secondary | ICD-10-CM | POA: Diagnosis not present

## 2015-10-29 DIAGNOSIS — R262 Difficulty in walking, not elsewhere classified: Secondary | ICD-10-CM | POA: Diagnosis not present

## 2015-10-29 DIAGNOSIS — M79606 Pain in leg, unspecified: Secondary | ICD-10-CM | POA: Diagnosis not present

## 2015-10-29 DIAGNOSIS — K860 Alcohol-induced chronic pancreatitis: Secondary | ICD-10-CM | POA: Diagnosis not present

## 2015-10-29 DIAGNOSIS — R531 Weakness: Secondary | ICD-10-CM | POA: Diagnosis not present

## 2015-10-29 DIAGNOSIS — M6281 Muscle weakness (generalized): Secondary | ICD-10-CM | POA: Diagnosis not present

## 2015-11-04 DIAGNOSIS — G603 Idiopathic progressive neuropathy: Secondary | ICD-10-CM | POA: Diagnosis not present

## 2015-11-04 DIAGNOSIS — G2581 Restless legs syndrome: Secondary | ICD-10-CM | POA: Diagnosis not present

## 2015-11-05 DIAGNOSIS — R262 Difficulty in walking, not elsewhere classified: Secondary | ICD-10-CM | POA: Diagnosis not present

## 2015-11-05 DIAGNOSIS — M79606 Pain in leg, unspecified: Secondary | ICD-10-CM | POA: Diagnosis not present

## 2015-11-05 DIAGNOSIS — M6281 Muscle weakness (generalized): Secondary | ICD-10-CM | POA: Diagnosis not present

## 2015-11-05 DIAGNOSIS — R531 Weakness: Secondary | ICD-10-CM | POA: Diagnosis not present

## 2015-11-11 DIAGNOSIS — M6281 Muscle weakness (generalized): Secondary | ICD-10-CM | POA: Diagnosis not present

## 2015-11-11 DIAGNOSIS — R531 Weakness: Secondary | ICD-10-CM | POA: Diagnosis not present

## 2015-11-11 DIAGNOSIS — R262 Difficulty in walking, not elsewhere classified: Secondary | ICD-10-CM | POA: Diagnosis not present

## 2015-11-11 DIAGNOSIS — M79606 Pain in leg, unspecified: Secondary | ICD-10-CM | POA: Diagnosis not present

## 2015-11-14 DIAGNOSIS — Z471 Aftercare following joint replacement surgery: Secondary | ICD-10-CM | POA: Diagnosis not present

## 2015-11-14 DIAGNOSIS — M5442 Lumbago with sciatica, left side: Secondary | ICD-10-CM | POA: Diagnosis not present

## 2015-11-14 DIAGNOSIS — Z96651 Presence of right artificial knee joint: Secondary | ICD-10-CM | POA: Diagnosis not present

## 2015-11-19 DIAGNOSIS — M47816 Spondylosis without myelopathy or radiculopathy, lumbar region: Secondary | ICD-10-CM | POA: Diagnosis not present

## 2015-11-19 DIAGNOSIS — K5900 Constipation, unspecified: Secondary | ICD-10-CM | POA: Diagnosis not present

## 2015-11-19 DIAGNOSIS — R748 Abnormal levels of other serum enzymes: Secondary | ICD-10-CM | POA: Diagnosis not present

## 2015-11-19 DIAGNOSIS — G603 Idiopathic progressive neuropathy: Secondary | ICD-10-CM | POA: Diagnosis not present

## 2015-11-29 DIAGNOSIS — K760 Fatty (change of) liver, not elsewhere classified: Secondary | ICD-10-CM | POA: Diagnosis not present

## 2015-11-29 DIAGNOSIS — R748 Abnormal levels of other serum enzymes: Secondary | ICD-10-CM | POA: Diagnosis not present

## 2015-11-29 DIAGNOSIS — K769 Liver disease, unspecified: Secondary | ICD-10-CM | POA: Diagnosis not present

## 2015-12-09 DIAGNOSIS — M545 Low back pain: Secondary | ICD-10-CM | POA: Diagnosis not present

## 2015-12-09 DIAGNOSIS — M6281 Muscle weakness (generalized): Secondary | ICD-10-CM | POA: Diagnosis not present

## 2015-12-09 DIAGNOSIS — G609 Hereditary and idiopathic neuropathy, unspecified: Secondary | ICD-10-CM | POA: Diagnosis not present

## 2015-12-09 DIAGNOSIS — M5416 Radiculopathy, lumbar region: Secondary | ICD-10-CM | POA: Diagnosis not present

## 2015-12-09 DIAGNOSIS — R269 Unspecified abnormalities of gait and mobility: Secondary | ICD-10-CM | POA: Diagnosis not present

## 2015-12-16 DIAGNOSIS — M47812 Spondylosis without myelopathy or radiculopathy, cervical region: Secondary | ICD-10-CM | POA: Diagnosis not present

## 2015-12-16 DIAGNOSIS — R269 Unspecified abnormalities of gait and mobility: Secondary | ICD-10-CM | POA: Diagnosis not present

## 2015-12-16 DIAGNOSIS — M5136 Other intervertebral disc degeneration, lumbar region: Secondary | ICD-10-CM | POA: Diagnosis not present

## 2015-12-16 DIAGNOSIS — M5030 Other cervical disc degeneration, unspecified cervical region: Secondary | ICD-10-CM | POA: Diagnosis not present

## 2015-12-16 DIAGNOSIS — M47816 Spondylosis without myelopathy or radiculopathy, lumbar region: Secondary | ICD-10-CM | POA: Diagnosis not present

## 2015-12-16 DIAGNOSIS — M6281 Muscle weakness (generalized): Secondary | ICD-10-CM | POA: Diagnosis not present

## 2015-12-16 DIAGNOSIS — M47892 Other spondylosis, cervical region: Secondary | ICD-10-CM | POA: Diagnosis not present

## 2015-12-16 DIAGNOSIS — M5416 Radiculopathy, lumbar region: Secondary | ICD-10-CM | POA: Diagnosis not present

## 2015-12-16 DIAGNOSIS — M47896 Other spondylosis, lumbar region: Secondary | ICD-10-CM | POA: Diagnosis not present

## 2015-12-18 DIAGNOSIS — R05 Cough: Secondary | ICD-10-CM | POA: Diagnosis not present

## 2015-12-18 DIAGNOSIS — J342 Deviated nasal septum: Secondary | ICD-10-CM | POA: Diagnosis not present

## 2015-12-18 DIAGNOSIS — H6123 Impacted cerumen, bilateral: Secondary | ICD-10-CM | POA: Diagnosis not present

## 2015-12-24 DIAGNOSIS — G603 Idiopathic progressive neuropathy: Secondary | ICD-10-CM | POA: Diagnosis not present

## 2015-12-24 DIAGNOSIS — I10 Essential (primary) hypertension: Secondary | ICD-10-CM | POA: Diagnosis not present

## 2015-12-24 DIAGNOSIS — E039 Hypothyroidism, unspecified: Secondary | ICD-10-CM | POA: Diagnosis not present

## 2015-12-24 DIAGNOSIS — F331 Major depressive disorder, recurrent, moderate: Secondary | ICD-10-CM | POA: Diagnosis not present

## 2015-12-24 DIAGNOSIS — M47816 Spondylosis without myelopathy or radiculopathy, lumbar region: Secondary | ICD-10-CM | POA: Diagnosis not present

## 2016-01-08 DIAGNOSIS — M25511 Pain in right shoulder: Secondary | ICD-10-CM | POA: Diagnosis not present

## 2016-01-08 DIAGNOSIS — R937 Abnormal findings on diagnostic imaging of other parts of musculoskeletal system: Secondary | ICD-10-CM | POA: Diagnosis not present

## 2016-02-04 DIAGNOSIS — E039 Hypothyroidism, unspecified: Secondary | ICD-10-CM | POA: Diagnosis not present

## 2016-02-04 DIAGNOSIS — I10 Essential (primary) hypertension: Secondary | ICD-10-CM | POA: Diagnosis not present

## 2016-02-04 DIAGNOSIS — M25511 Pain in right shoulder: Secondary | ICD-10-CM | POA: Diagnosis not present

## 2016-02-04 DIAGNOSIS — G603 Idiopathic progressive neuropathy: Secondary | ICD-10-CM | POA: Diagnosis not present

## 2016-02-04 DIAGNOSIS — K219 Gastro-esophageal reflux disease without esophagitis: Secondary | ICD-10-CM | POA: Diagnosis not present

## 2016-02-04 DIAGNOSIS — E785 Hyperlipidemia, unspecified: Secondary | ICD-10-CM | POA: Diagnosis not present

## 2016-02-04 DIAGNOSIS — Z79899 Other long term (current) drug therapy: Secondary | ICD-10-CM | POA: Diagnosis not present

## 2016-02-18 DIAGNOSIS — M25511 Pain in right shoulder: Secondary | ICD-10-CM | POA: Diagnosis not present

## 2016-02-18 DIAGNOSIS — M19011 Primary osteoarthritis, right shoulder: Secondary | ICD-10-CM | POA: Diagnosis not present

## 2016-03-26 DIAGNOSIS — M19011 Primary osteoarthritis, right shoulder: Secondary | ICD-10-CM | POA: Diagnosis not present

## 2016-04-13 DIAGNOSIS — H43813 Vitreous degeneration, bilateral: Secondary | ICD-10-CM | POA: Diagnosis not present

## 2016-04-13 DIAGNOSIS — H43393 Other vitreous opacities, bilateral: Secondary | ICD-10-CM | POA: Diagnosis not present

## 2016-04-13 DIAGNOSIS — H524 Presbyopia: Secondary | ICD-10-CM | POA: Diagnosis not present

## 2016-04-13 DIAGNOSIS — Z9841 Cataract extraction status, right eye: Secondary | ICD-10-CM | POA: Diagnosis not present

## 2016-04-13 DIAGNOSIS — H5203 Hypermetropia, bilateral: Secondary | ICD-10-CM | POA: Diagnosis not present

## 2016-04-13 DIAGNOSIS — H35343 Macular cyst, hole, or pseudohole, bilateral: Secondary | ICD-10-CM | POA: Diagnosis not present

## 2016-04-13 DIAGNOSIS — Z9842 Cataract extraction status, left eye: Secondary | ICD-10-CM | POA: Diagnosis not present

## 2016-04-13 DIAGNOSIS — H35373 Puckering of macula, bilateral: Secondary | ICD-10-CM | POA: Diagnosis not present

## 2016-04-13 DIAGNOSIS — H52223 Regular astigmatism, bilateral: Secondary | ICD-10-CM | POA: Diagnosis not present

## 2016-04-13 DIAGNOSIS — H43313 Vitreous membranes and strands, bilateral: Secondary | ICD-10-CM | POA: Diagnosis not present

## 2016-04-13 DIAGNOSIS — Z961 Presence of intraocular lens: Secondary | ICD-10-CM | POA: Diagnosis not present

## 2016-04-20 DIAGNOSIS — H61891 Other specified disorders of right external ear: Secondary | ICD-10-CM | POA: Diagnosis not present

## 2016-04-20 DIAGNOSIS — L299 Pruritus, unspecified: Secondary | ICD-10-CM | POA: Diagnosis not present

## 2016-04-23 DIAGNOSIS — M778 Other enthesopathies, not elsewhere classified: Secondary | ICD-10-CM | POA: Diagnosis not present

## 2016-04-23 DIAGNOSIS — G603 Idiopathic progressive neuropathy: Secondary | ICD-10-CM | POA: Diagnosis not present

## 2016-04-23 DIAGNOSIS — M79674 Pain in right toe(s): Secondary | ICD-10-CM | POA: Diagnosis not present

## 2016-04-23 DIAGNOSIS — M47816 Spondylosis without myelopathy or radiculopathy, lumbar region: Secondary | ICD-10-CM | POA: Diagnosis not present

## 2016-05-05 DIAGNOSIS — M79605 Pain in left leg: Secondary | ICD-10-CM | POA: Diagnosis not present

## 2016-05-05 DIAGNOSIS — M47816 Spondylosis without myelopathy or radiculopathy, lumbar region: Secondary | ICD-10-CM | POA: Diagnosis not present

## 2016-05-05 DIAGNOSIS — R0602 Shortness of breath: Secondary | ICD-10-CM | POA: Diagnosis not present

## 2016-05-05 DIAGNOSIS — M79604 Pain in right leg: Secondary | ICD-10-CM | POA: Diagnosis not present

## 2016-05-11 DIAGNOSIS — M7989 Other specified soft tissue disorders: Secondary | ICD-10-CM | POA: Diagnosis not present

## 2016-05-11 DIAGNOSIS — R7989 Other specified abnormal findings of blood chemistry: Secondary | ICD-10-CM | POA: Diagnosis not present

## 2016-05-11 DIAGNOSIS — M79605 Pain in left leg: Secondary | ICD-10-CM | POA: Diagnosis not present

## 2016-05-11 DIAGNOSIS — M79604 Pain in right leg: Secondary | ICD-10-CM | POA: Diagnosis not present

## 2016-05-14 ENCOUNTER — Ambulatory Visit: Payer: Self-pay | Admitting: Sports Medicine

## 2016-05-19 DIAGNOSIS — Z96651 Presence of right artificial knee joint: Secondary | ICD-10-CM | POA: Diagnosis not present

## 2016-05-19 DIAGNOSIS — Z471 Aftercare following joint replacement surgery: Secondary | ICD-10-CM | POA: Diagnosis not present

## 2016-06-02 DIAGNOSIS — M47816 Spondylosis without myelopathy or radiculopathy, lumbar region: Secondary | ICD-10-CM | POA: Diagnosis not present

## 2016-06-02 DIAGNOSIS — R413 Other amnesia: Secondary | ICD-10-CM | POA: Diagnosis not present

## 2016-06-02 DIAGNOSIS — G603 Idiopathic progressive neuropathy: Secondary | ICD-10-CM | POA: Diagnosis not present

## 2016-06-02 DIAGNOSIS — F419 Anxiety disorder, unspecified: Secondary | ICD-10-CM | POA: Diagnosis not present

## 2016-06-02 DIAGNOSIS — E039 Hypothyroidism, unspecified: Secondary | ICD-10-CM | POA: Diagnosis not present

## 2016-06-08 DIAGNOSIS — M25661 Stiffness of right knee, not elsewhere classified: Secondary | ICD-10-CM | POA: Diagnosis not present

## 2016-06-08 DIAGNOSIS — R2689 Other abnormalities of gait and mobility: Secondary | ICD-10-CM | POA: Diagnosis not present

## 2016-06-08 DIAGNOSIS — M6281 Muscle weakness (generalized): Secondary | ICD-10-CM | POA: Diagnosis not present

## 2016-06-18 DIAGNOSIS — Z471 Aftercare following joint replacement surgery: Secondary | ICD-10-CM | POA: Diagnosis not present

## 2016-06-18 DIAGNOSIS — Z96651 Presence of right artificial knee joint: Secondary | ICD-10-CM | POA: Diagnosis not present

## 2016-06-25 DIAGNOSIS — M6281 Muscle weakness (generalized): Secondary | ICD-10-CM | POA: Diagnosis not present

## 2016-06-25 DIAGNOSIS — R2689 Other abnormalities of gait and mobility: Secondary | ICD-10-CM | POA: Diagnosis not present

## 2016-06-25 DIAGNOSIS — M25661 Stiffness of right knee, not elsewhere classified: Secondary | ICD-10-CM | POA: Diagnosis not present

## 2016-06-30 DIAGNOSIS — E785 Hyperlipidemia, unspecified: Secondary | ICD-10-CM | POA: Diagnosis not present

## 2016-06-30 DIAGNOSIS — R0982 Postnasal drip: Secondary | ICD-10-CM | POA: Diagnosis not present

## 2016-06-30 DIAGNOSIS — Z79899 Other long term (current) drug therapy: Secondary | ICD-10-CM | POA: Diagnosis not present

## 2016-06-30 DIAGNOSIS — M47816 Spondylosis without myelopathy or radiculopathy, lumbar region: Secondary | ICD-10-CM | POA: Diagnosis not present

## 2016-06-30 DIAGNOSIS — E039 Hypothyroidism, unspecified: Secondary | ICD-10-CM | POA: Diagnosis not present

## 2016-06-30 DIAGNOSIS — G603 Idiopathic progressive neuropathy: Secondary | ICD-10-CM | POA: Diagnosis not present

## 2016-06-30 DIAGNOSIS — R413 Other amnesia: Secondary | ICD-10-CM | POA: Diagnosis not present

## 2016-06-30 DIAGNOSIS — I10 Essential (primary) hypertension: Secondary | ICD-10-CM | POA: Diagnosis not present

## 2016-07-03 DIAGNOSIS — R2689 Other abnormalities of gait and mobility: Secondary | ICD-10-CM | POA: Diagnosis not present

## 2016-07-03 DIAGNOSIS — M6281 Muscle weakness (generalized): Secondary | ICD-10-CM | POA: Diagnosis not present

## 2016-07-03 DIAGNOSIS — M25661 Stiffness of right knee, not elsewhere classified: Secondary | ICD-10-CM | POA: Diagnosis not present

## 2016-07-06 DIAGNOSIS — M25661 Stiffness of right knee, not elsewhere classified: Secondary | ICD-10-CM | POA: Diagnosis not present

## 2016-07-06 DIAGNOSIS — R2689 Other abnormalities of gait and mobility: Secondary | ICD-10-CM | POA: Diagnosis not present

## 2016-07-06 DIAGNOSIS — M6281 Muscle weakness (generalized): Secondary | ICD-10-CM | POA: Diagnosis not present

## 2016-07-09 DIAGNOSIS — R2689 Other abnormalities of gait and mobility: Secondary | ICD-10-CM | POA: Diagnosis not present

## 2016-07-09 DIAGNOSIS — M549 Dorsalgia, unspecified: Secondary | ICD-10-CM | POA: Diagnosis not present

## 2016-07-09 DIAGNOSIS — M6281 Muscle weakness (generalized): Secondary | ICD-10-CM | POA: Diagnosis not present

## 2016-07-09 DIAGNOSIS — M25661 Stiffness of right knee, not elsewhere classified: Secondary | ICD-10-CM | POA: Diagnosis not present

## 2016-07-13 DIAGNOSIS — M25661 Stiffness of right knee, not elsewhere classified: Secondary | ICD-10-CM | POA: Diagnosis not present

## 2016-07-13 DIAGNOSIS — R2689 Other abnormalities of gait and mobility: Secondary | ICD-10-CM | POA: Diagnosis not present

## 2016-07-13 DIAGNOSIS — M6281 Muscle weakness (generalized): Secondary | ICD-10-CM | POA: Diagnosis not present

## 2016-07-14 DIAGNOSIS — R079 Chest pain, unspecified: Secondary | ICD-10-CM | POA: Diagnosis not present

## 2016-07-14 DIAGNOSIS — G2581 Restless legs syndrome: Secondary | ICD-10-CM | POA: Diagnosis not present

## 2016-07-14 DIAGNOSIS — Z79899 Other long term (current) drug therapy: Secondary | ICD-10-CM | POA: Diagnosis not present

## 2016-07-14 DIAGNOSIS — F0390 Unspecified dementia without behavioral disturbance: Secondary | ICD-10-CM | POA: Diagnosis not present

## 2016-07-14 DIAGNOSIS — E039 Hypothyroidism, unspecified: Secondary | ICD-10-CM | POA: Diagnosis not present

## 2016-07-14 DIAGNOSIS — R7989 Other specified abnormal findings of blood chemistry: Secondary | ICD-10-CM | POA: Diagnosis not present

## 2016-07-14 DIAGNOSIS — K219 Gastro-esophageal reflux disease without esophagitis: Secondary | ICD-10-CM | POA: Diagnosis not present

## 2016-07-14 DIAGNOSIS — E7800 Pure hypercholesterolemia, unspecified: Secondary | ICD-10-CM | POA: Diagnosis not present

## 2016-07-14 DIAGNOSIS — R0602 Shortness of breath: Secondary | ICD-10-CM | POA: Diagnosis not present

## 2016-07-14 DIAGNOSIS — Z7982 Long term (current) use of aspirin: Secondary | ICD-10-CM | POA: Diagnosis not present

## 2016-07-14 DIAGNOSIS — Z87891 Personal history of nicotine dependence: Secondary | ICD-10-CM | POA: Diagnosis not present

## 2016-07-14 DIAGNOSIS — R072 Precordial pain: Secondary | ICD-10-CM | POA: Diagnosis not present

## 2016-07-14 DIAGNOSIS — Z86718 Personal history of other venous thrombosis and embolism: Secondary | ICD-10-CM | POA: Diagnosis not present

## 2016-07-15 DIAGNOSIS — R079 Chest pain, unspecified: Secondary | ICD-10-CM | POA: Diagnosis not present

## 2016-08-26 DIAGNOSIS — L303 Infective dermatitis: Secondary | ICD-10-CM | POA: Diagnosis not present

## 2016-09-09 DIAGNOSIS — M79606 Pain in leg, unspecified: Secondary | ICD-10-CM | POA: Diagnosis not present

## 2016-09-09 DIAGNOSIS — R259 Unspecified abnormal involuntary movements: Secondary | ICD-10-CM | POA: Diagnosis not present

## 2016-09-09 DIAGNOSIS — S91011A Laceration without foreign body, right ankle, initial encounter: Secondary | ICD-10-CM | POA: Diagnosis not present

## 2016-09-09 DIAGNOSIS — Z23 Encounter for immunization: Secondary | ICD-10-CM | POA: Diagnosis not present

## 2016-09-21 DIAGNOSIS — L03115 Cellulitis of right lower limb: Secondary | ICD-10-CM | POA: Diagnosis not present

## 2016-09-21 DIAGNOSIS — R251 Tremor, unspecified: Secondary | ICD-10-CM | POA: Diagnosis not present

## 2016-09-21 DIAGNOSIS — B3789 Other sites of candidiasis: Secondary | ICD-10-CM | POA: Diagnosis not present

## 2016-09-25 DIAGNOSIS — L03115 Cellulitis of right lower limb: Secondary | ICD-10-CM | POA: Diagnosis not present

## 2016-10-26 DIAGNOSIS — R251 Tremor, unspecified: Secondary | ICD-10-CM | POA: Diagnosis not present

## 2016-10-26 DIAGNOSIS — J069 Acute upper respiratory infection, unspecified: Secondary | ICD-10-CM | POA: Diagnosis not present

## 2016-11-26 DIAGNOSIS — M19011 Primary osteoarthritis, right shoulder: Secondary | ICD-10-CM | POA: Diagnosis not present

## 2016-11-26 DIAGNOSIS — M67911 Unspecified disorder of synovium and tendon, right shoulder: Secondary | ICD-10-CM | POA: Diagnosis not present

## 2016-12-04 DIAGNOSIS — M5030 Other cervical disc degeneration, unspecified cervical region: Secondary | ICD-10-CM | POA: Diagnosis not present

## 2016-12-04 DIAGNOSIS — M7551 Bursitis of right shoulder: Secondary | ICD-10-CM | POA: Diagnosis not present

## 2016-12-04 DIAGNOSIS — M1991 Primary osteoarthritis, unspecified site: Secondary | ICD-10-CM | POA: Diagnosis not present

## 2016-12-04 DIAGNOSIS — Z800 Family history of malignant neoplasm of digestive organs: Secondary | ICD-10-CM | POA: Diagnosis not present

## 2016-12-04 DIAGNOSIS — K5790 Diverticulosis of intestine, part unspecified, without perforation or abscess without bleeding: Secondary | ICD-10-CM | POA: Diagnosis not present

## 2016-12-04 DIAGNOSIS — S43431D Superior glenoid labrum lesion of right shoulder, subsequent encounter: Secondary | ICD-10-CM | POA: Diagnosis not present

## 2016-12-04 DIAGNOSIS — M797 Fibromyalgia: Secondary | ICD-10-CM | POA: Diagnosis not present

## 2016-12-04 DIAGNOSIS — Z8673 Personal history of transient ischemic attack (TIA), and cerebral infarction without residual deficits: Secondary | ICD-10-CM | POA: Diagnosis not present

## 2016-12-04 DIAGNOSIS — I10 Essential (primary) hypertension: Secondary | ICD-10-CM | POA: Diagnosis not present

## 2016-12-04 DIAGNOSIS — Z87891 Personal history of nicotine dependence: Secondary | ICD-10-CM | POA: Diagnosis not present

## 2016-12-04 DIAGNOSIS — M67911 Unspecified disorder of synovium and tendon, right shoulder: Secondary | ICD-10-CM | POA: Diagnosis not present

## 2016-12-04 DIAGNOSIS — M19011 Primary osteoarthritis, right shoulder: Secondary | ICD-10-CM | POA: Diagnosis not present

## 2016-12-08 DIAGNOSIS — S43431D Superior glenoid labrum lesion of right shoulder, subsequent encounter: Secondary | ICD-10-CM | POA: Diagnosis not present

## 2016-12-08 DIAGNOSIS — M5030 Other cervical disc degeneration, unspecified cervical region: Secondary | ICD-10-CM | POA: Diagnosis not present

## 2016-12-08 DIAGNOSIS — M7551 Bursitis of right shoulder: Secondary | ICD-10-CM | POA: Diagnosis not present

## 2016-12-08 DIAGNOSIS — M19011 Primary osteoarthritis, right shoulder: Secondary | ICD-10-CM | POA: Diagnosis not present

## 2016-12-08 DIAGNOSIS — M67911 Unspecified disorder of synovium and tendon, right shoulder: Secondary | ICD-10-CM | POA: Diagnosis not present

## 2016-12-08 DIAGNOSIS — I10 Essential (primary) hypertension: Secondary | ICD-10-CM | POA: Diagnosis not present

## 2016-12-10 DIAGNOSIS — S43431D Superior glenoid labrum lesion of right shoulder, subsequent encounter: Secondary | ICD-10-CM | POA: Diagnosis not present

## 2016-12-10 DIAGNOSIS — M19011 Primary osteoarthritis, right shoulder: Secondary | ICD-10-CM | POA: Diagnosis not present

## 2016-12-10 DIAGNOSIS — I10 Essential (primary) hypertension: Secondary | ICD-10-CM | POA: Diagnosis not present

## 2016-12-10 DIAGNOSIS — M5030 Other cervical disc degeneration, unspecified cervical region: Secondary | ICD-10-CM | POA: Diagnosis not present

## 2016-12-10 DIAGNOSIS — M67911 Unspecified disorder of synovium and tendon, right shoulder: Secondary | ICD-10-CM | POA: Diagnosis not present

## 2016-12-10 DIAGNOSIS — M7551 Bursitis of right shoulder: Secondary | ICD-10-CM | POA: Diagnosis not present

## 2016-12-17 DIAGNOSIS — R8290 Unspecified abnormal findings in urine: Secondary | ICD-10-CM | POA: Diagnosis not present

## 2016-12-18 DIAGNOSIS — S43431D Superior glenoid labrum lesion of right shoulder, subsequent encounter: Secondary | ICD-10-CM | POA: Diagnosis not present

## 2016-12-18 DIAGNOSIS — M5030 Other cervical disc degeneration, unspecified cervical region: Secondary | ICD-10-CM | POA: Diagnosis not present

## 2016-12-18 DIAGNOSIS — M7551 Bursitis of right shoulder: Secondary | ICD-10-CM | POA: Diagnosis not present

## 2016-12-18 DIAGNOSIS — M67911 Unspecified disorder of synovium and tendon, right shoulder: Secondary | ICD-10-CM | POA: Diagnosis not present

## 2016-12-18 DIAGNOSIS — I10 Essential (primary) hypertension: Secondary | ICD-10-CM | POA: Diagnosis not present

## 2016-12-18 DIAGNOSIS — M19011 Primary osteoarthritis, right shoulder: Secondary | ICD-10-CM | POA: Diagnosis not present

## 2016-12-22 DIAGNOSIS — I10 Essential (primary) hypertension: Secondary | ICD-10-CM | POA: Diagnosis not present

## 2016-12-22 DIAGNOSIS — M5030 Other cervical disc degeneration, unspecified cervical region: Secondary | ICD-10-CM | POA: Diagnosis not present

## 2016-12-22 DIAGNOSIS — S43431D Superior glenoid labrum lesion of right shoulder, subsequent encounter: Secondary | ICD-10-CM | POA: Diagnosis not present

## 2016-12-22 DIAGNOSIS — M7551 Bursitis of right shoulder: Secondary | ICD-10-CM | POA: Diagnosis not present

## 2016-12-22 DIAGNOSIS — M19011 Primary osteoarthritis, right shoulder: Secondary | ICD-10-CM | POA: Diagnosis not present

## 2016-12-22 DIAGNOSIS — M67911 Unspecified disorder of synovium and tendon, right shoulder: Secondary | ICD-10-CM | POA: Diagnosis not present

## 2016-12-23 DIAGNOSIS — R8290 Unspecified abnormal findings in urine: Secondary | ICD-10-CM | POA: Diagnosis not present

## 2016-12-28 DIAGNOSIS — M19011 Primary osteoarthritis, right shoulder: Secondary | ICD-10-CM | POA: Diagnosis not present

## 2016-12-28 DIAGNOSIS — M7551 Bursitis of right shoulder: Secondary | ICD-10-CM | POA: Diagnosis not present

## 2016-12-28 DIAGNOSIS — M67911 Unspecified disorder of synovium and tendon, right shoulder: Secondary | ICD-10-CM | POA: Diagnosis not present

## 2016-12-28 DIAGNOSIS — M5030 Other cervical disc degeneration, unspecified cervical region: Secondary | ICD-10-CM | POA: Diagnosis not present

## 2016-12-28 DIAGNOSIS — I10 Essential (primary) hypertension: Secondary | ICD-10-CM | POA: Diagnosis not present

## 2016-12-28 DIAGNOSIS — S43431D Superior glenoid labrum lesion of right shoulder, subsequent encounter: Secondary | ICD-10-CM | POA: Diagnosis not present

## 2016-12-29 DIAGNOSIS — R278 Other lack of coordination: Secondary | ICD-10-CM | POA: Diagnosis not present

## 2016-12-29 DIAGNOSIS — R251 Tremor, unspecified: Secondary | ICD-10-CM | POA: Diagnosis not present

## 2016-12-29 DIAGNOSIS — G603 Idiopathic progressive neuropathy: Secondary | ICD-10-CM | POA: Diagnosis not present

## 2016-12-29 DIAGNOSIS — Z9181 History of falling: Secondary | ICD-10-CM | POA: Diagnosis not present

## 2016-12-29 DIAGNOSIS — G2581 Restless legs syndrome: Secondary | ICD-10-CM | POA: Diagnosis not present

## 2016-12-31 DIAGNOSIS — M19011 Primary osteoarthritis, right shoulder: Secondary | ICD-10-CM | POA: Diagnosis not present

## 2016-12-31 DIAGNOSIS — S43431D Superior glenoid labrum lesion of right shoulder, subsequent encounter: Secondary | ICD-10-CM | POA: Diagnosis not present

## 2016-12-31 DIAGNOSIS — M67911 Unspecified disorder of synovium and tendon, right shoulder: Secondary | ICD-10-CM | POA: Diagnosis not present

## 2016-12-31 DIAGNOSIS — I10 Essential (primary) hypertension: Secondary | ICD-10-CM | POA: Diagnosis not present

## 2016-12-31 DIAGNOSIS — M7551 Bursitis of right shoulder: Secondary | ICD-10-CM | POA: Diagnosis not present

## 2016-12-31 DIAGNOSIS — M5030 Other cervical disc degeneration, unspecified cervical region: Secondary | ICD-10-CM | POA: Diagnosis not present

## 2017-01-19 DIAGNOSIS — M19011 Primary osteoarthritis, right shoulder: Secondary | ICD-10-CM | POA: Diagnosis not present

## 2017-01-19 DIAGNOSIS — M67911 Unspecified disorder of synovium and tendon, right shoulder: Secondary | ICD-10-CM | POA: Diagnosis not present

## 2017-03-22 DIAGNOSIS — E039 Hypothyroidism, unspecified: Secondary | ICD-10-CM | POA: Diagnosis not present

## 2017-03-22 DIAGNOSIS — F419 Anxiety disorder, unspecified: Secondary | ICD-10-CM | POA: Diagnosis not present

## 2017-03-22 DIAGNOSIS — B372 Candidiasis of skin and nail: Secondary | ICD-10-CM | POA: Diagnosis not present

## 2017-03-22 DIAGNOSIS — R259 Unspecified abnormal involuntary movements: Secondary | ICD-10-CM | POA: Diagnosis not present

## 2017-03-22 DIAGNOSIS — R29898 Other symptoms and signs involving the musculoskeletal system: Secondary | ICD-10-CM | POA: Diagnosis not present

## 2017-03-22 DIAGNOSIS — M47816 Spondylosis without myelopathy or radiculopathy, lumbar region: Secondary | ICD-10-CM | POA: Diagnosis not present

## 2017-03-22 DIAGNOSIS — J45909 Unspecified asthma, uncomplicated: Secondary | ICD-10-CM | POA: Diagnosis not present

## 2017-04-02 DIAGNOSIS — M5030 Other cervical disc degeneration, unspecified cervical region: Secondary | ICD-10-CM | POA: Diagnosis not present

## 2017-04-02 DIAGNOSIS — Z79899 Other long term (current) drug therapy: Secondary | ICD-10-CM | POA: Diagnosis not present

## 2017-04-02 DIAGNOSIS — J45909 Unspecified asthma, uncomplicated: Secondary | ICD-10-CM | POA: Diagnosis not present

## 2017-04-02 DIAGNOSIS — Z9181 History of falling: Secondary | ICD-10-CM | POA: Diagnosis not present

## 2017-04-02 DIAGNOSIS — Z7982 Long term (current) use of aspirin: Secondary | ICD-10-CM | POA: Diagnosis not present

## 2017-04-02 DIAGNOSIS — Z8673 Personal history of transient ischemic attack (TIA), and cerebral infarction without residual deficits: Secondary | ICD-10-CM | POA: Diagnosis not present

## 2017-04-02 DIAGNOSIS — F419 Anxiety disorder, unspecified: Secondary | ICD-10-CM | POA: Diagnosis not present

## 2017-04-02 DIAGNOSIS — M47816 Spondylosis without myelopathy or radiculopathy, lumbar region: Secondary | ICD-10-CM | POA: Diagnosis not present

## 2017-04-02 DIAGNOSIS — Z87891 Personal history of nicotine dependence: Secondary | ICD-10-CM | POA: Diagnosis not present

## 2017-04-02 DIAGNOSIS — I10 Essential (primary) hypertension: Secondary | ICD-10-CM | POA: Diagnosis not present

## 2017-04-02 DIAGNOSIS — R259 Unspecified abnormal involuntary movements: Secondary | ICD-10-CM | POA: Diagnosis not present

## 2017-04-02 DIAGNOSIS — G603 Idiopathic progressive neuropathy: Secondary | ICD-10-CM | POA: Diagnosis not present

## 2017-04-02 DIAGNOSIS — M1991 Primary osteoarthritis, unspecified site: Secondary | ICD-10-CM | POA: Diagnosis not present

## 2017-04-06 DIAGNOSIS — S51811A Laceration without foreign body of right forearm, initial encounter: Secondary | ICD-10-CM | POA: Diagnosis not present

## 2017-04-06 DIAGNOSIS — S0512XA Contusion of eyeball and orbital tissues, left eye, initial encounter: Secondary | ICD-10-CM | POA: Diagnosis not present

## 2017-04-06 DIAGNOSIS — H5710 Ocular pain, unspecified eye: Secondary | ICD-10-CM | POA: Diagnosis not present

## 2017-04-06 DIAGNOSIS — R102 Pelvic and perineal pain: Secondary | ICD-10-CM | POA: Diagnosis not present

## 2017-04-06 DIAGNOSIS — Z23 Encounter for immunization: Secondary | ICD-10-CM | POA: Diagnosis not present

## 2017-04-06 DIAGNOSIS — S0003XA Contusion of scalp, initial encounter: Secondary | ICD-10-CM | POA: Diagnosis not present

## 2017-04-06 DIAGNOSIS — R079 Chest pain, unspecified: Secondary | ICD-10-CM | POA: Diagnosis not present

## 2017-04-06 DIAGNOSIS — M79661 Pain in right lower leg: Secondary | ICD-10-CM | POA: Diagnosis not present

## 2017-04-06 DIAGNOSIS — S0990XA Unspecified injury of head, initial encounter: Secondary | ICD-10-CM | POA: Diagnosis not present

## 2017-04-06 DIAGNOSIS — S8991XA Unspecified injury of right lower leg, initial encounter: Secondary | ICD-10-CM | POA: Diagnosis not present

## 2017-04-06 DIAGNOSIS — M79631 Pain in right forearm: Secondary | ICD-10-CM | POA: Diagnosis not present

## 2017-04-06 DIAGNOSIS — S81811A Laceration without foreign body, right lower leg, initial encounter: Secondary | ICD-10-CM | POA: Diagnosis not present

## 2017-04-07 DIAGNOSIS — M6281 Muscle weakness (generalized): Secondary | ICD-10-CM | POA: Diagnosis not present

## 2017-04-07 DIAGNOSIS — M5030 Other cervical disc degeneration, unspecified cervical region: Secondary | ICD-10-CM | POA: Diagnosis not present

## 2017-04-07 DIAGNOSIS — I10 Essential (primary) hypertension: Secondary | ICD-10-CM | POA: Diagnosis not present

## 2017-04-07 DIAGNOSIS — J45909 Unspecified asthma, uncomplicated: Secondary | ICD-10-CM | POA: Diagnosis not present

## 2017-04-07 DIAGNOSIS — M47816 Spondylosis without myelopathy or radiculopathy, lumbar region: Secondary | ICD-10-CM | POA: Diagnosis not present

## 2017-04-07 DIAGNOSIS — G603 Idiopathic progressive neuropathy: Secondary | ICD-10-CM | POA: Diagnosis not present

## 2017-04-07 DIAGNOSIS — R259 Unspecified abnormal involuntary movements: Secondary | ICD-10-CM | POA: Diagnosis not present

## 2017-04-08 DIAGNOSIS — R259 Unspecified abnormal involuntary movements: Secondary | ICD-10-CM | POA: Diagnosis not present

## 2017-04-08 DIAGNOSIS — I10 Essential (primary) hypertension: Secondary | ICD-10-CM | POA: Diagnosis not present

## 2017-04-08 DIAGNOSIS — J45909 Unspecified asthma, uncomplicated: Secondary | ICD-10-CM | POA: Diagnosis not present

## 2017-04-08 DIAGNOSIS — M5030 Other cervical disc degeneration, unspecified cervical region: Secondary | ICD-10-CM | POA: Diagnosis not present

## 2017-04-08 DIAGNOSIS — G603 Idiopathic progressive neuropathy: Secondary | ICD-10-CM | POA: Diagnosis not present

## 2017-04-08 DIAGNOSIS — M47816 Spondylosis without myelopathy or radiculopathy, lumbar region: Secondary | ICD-10-CM | POA: Diagnosis not present

## 2017-04-09 DIAGNOSIS — I10 Essential (primary) hypertension: Secondary | ICD-10-CM | POA: Diagnosis not present

## 2017-04-09 DIAGNOSIS — M47816 Spondylosis without myelopathy or radiculopathy, lumbar region: Secondary | ICD-10-CM | POA: Diagnosis not present

## 2017-04-09 DIAGNOSIS — G603 Idiopathic progressive neuropathy: Secondary | ICD-10-CM | POA: Diagnosis not present

## 2017-04-09 DIAGNOSIS — R259 Unspecified abnormal involuntary movements: Secondary | ICD-10-CM | POA: Diagnosis not present

## 2017-04-09 DIAGNOSIS — M5030 Other cervical disc degeneration, unspecified cervical region: Secondary | ICD-10-CM | POA: Diagnosis not present

## 2017-04-09 DIAGNOSIS — J45909 Unspecified asthma, uncomplicated: Secondary | ICD-10-CM | POA: Diagnosis not present

## 2017-04-10 DIAGNOSIS — R627 Adult failure to thrive: Secondary | ICD-10-CM | POA: Diagnosis not present

## 2017-04-10 DIAGNOSIS — Z8739 Personal history of other diseases of the musculoskeletal system and connective tissue: Secondary | ICD-10-CM | POA: Diagnosis not present

## 2017-04-10 DIAGNOSIS — Z8669 Personal history of other diseases of the nervous system and sense organs: Secondary | ICD-10-CM | POA: Diagnosis not present

## 2017-04-10 DIAGNOSIS — Z8679 Personal history of other diseases of the circulatory system: Secondary | ICD-10-CM | POA: Diagnosis not present

## 2017-04-10 DIAGNOSIS — Z8639 Personal history of other endocrine, nutritional and metabolic disease: Secondary | ICD-10-CM | POA: Diagnosis not present

## 2017-04-10 DIAGNOSIS — I639 Cerebral infarction, unspecified: Secondary | ICD-10-CM | POA: Diagnosis not present

## 2017-04-10 DIAGNOSIS — Z8719 Personal history of other diseases of the digestive system: Secondary | ICD-10-CM | POA: Diagnosis not present

## 2017-04-10 DIAGNOSIS — Z8673 Personal history of transient ischemic attack (TIA), and cerebral infarction without residual deficits: Secondary | ICD-10-CM | POA: Diagnosis not present

## 2017-04-11 DIAGNOSIS — Z8679 Personal history of other diseases of the circulatory system: Secondary | ICD-10-CM | POA: Diagnosis not present

## 2017-04-11 DIAGNOSIS — I639 Cerebral infarction, unspecified: Secondary | ICD-10-CM | POA: Diagnosis not present

## 2017-04-11 DIAGNOSIS — Z8639 Personal history of other endocrine, nutritional and metabolic disease: Secondary | ICD-10-CM | POA: Diagnosis not present

## 2017-04-11 DIAGNOSIS — Z8739 Personal history of other diseases of the musculoskeletal system and connective tissue: Secondary | ICD-10-CM | POA: Diagnosis not present

## 2017-04-11 DIAGNOSIS — Z8669 Personal history of other diseases of the nervous system and sense organs: Secondary | ICD-10-CM | POA: Diagnosis not present

## 2017-04-11 DIAGNOSIS — Z8719 Personal history of other diseases of the digestive system: Secondary | ICD-10-CM | POA: Diagnosis not present

## 2017-04-11 DIAGNOSIS — R627 Adult failure to thrive: Secondary | ICD-10-CM | POA: Diagnosis not present

## 2017-04-11 DIAGNOSIS — Z8673 Personal history of transient ischemic attack (TIA), and cerebral infarction without residual deficits: Secondary | ICD-10-CM | POA: Diagnosis not present

## 2017-04-12 DIAGNOSIS — I639 Cerebral infarction, unspecified: Secondary | ICD-10-CM | POA: Diagnosis not present

## 2017-04-12 DIAGNOSIS — Z8639 Personal history of other endocrine, nutritional and metabolic disease: Secondary | ICD-10-CM | POA: Diagnosis not present

## 2017-04-12 DIAGNOSIS — Z8679 Personal history of other diseases of the circulatory system: Secondary | ICD-10-CM | POA: Diagnosis not present

## 2017-04-12 DIAGNOSIS — R627 Adult failure to thrive: Secondary | ICD-10-CM | POA: Diagnosis not present

## 2017-04-12 DIAGNOSIS — Z8673 Personal history of transient ischemic attack (TIA), and cerebral infarction without residual deficits: Secondary | ICD-10-CM | POA: Diagnosis not present

## 2017-04-12 DIAGNOSIS — Z8739 Personal history of other diseases of the musculoskeletal system and connective tissue: Secondary | ICD-10-CM | POA: Diagnosis not present

## 2017-04-13 DIAGNOSIS — Z8679 Personal history of other diseases of the circulatory system: Secondary | ICD-10-CM | POA: Diagnosis not present

## 2017-04-13 DIAGNOSIS — Z8639 Personal history of other endocrine, nutritional and metabolic disease: Secondary | ICD-10-CM | POA: Diagnosis not present

## 2017-04-13 DIAGNOSIS — Z8739 Personal history of other diseases of the musculoskeletal system and connective tissue: Secondary | ICD-10-CM | POA: Diagnosis not present

## 2017-04-13 DIAGNOSIS — Z8673 Personal history of transient ischemic attack (TIA), and cerebral infarction without residual deficits: Secondary | ICD-10-CM | POA: Diagnosis not present

## 2017-04-13 DIAGNOSIS — I639 Cerebral infarction, unspecified: Secondary | ICD-10-CM | POA: Diagnosis not present

## 2017-04-13 DIAGNOSIS — R627 Adult failure to thrive: Secondary | ICD-10-CM | POA: Diagnosis not present

## 2017-04-15 DIAGNOSIS — I639 Cerebral infarction, unspecified: Secondary | ICD-10-CM | POA: Diagnosis not present

## 2017-04-15 DIAGNOSIS — Z8639 Personal history of other endocrine, nutritional and metabolic disease: Secondary | ICD-10-CM | POA: Diagnosis not present

## 2017-04-15 DIAGNOSIS — R627 Adult failure to thrive: Secondary | ICD-10-CM | POA: Diagnosis not present

## 2017-04-15 DIAGNOSIS — Z8673 Personal history of transient ischemic attack (TIA), and cerebral infarction without residual deficits: Secondary | ICD-10-CM | POA: Diagnosis not present

## 2017-04-15 DIAGNOSIS — Z8739 Personal history of other diseases of the musculoskeletal system and connective tissue: Secondary | ICD-10-CM | POA: Diagnosis not present

## 2017-04-15 DIAGNOSIS — Z8679 Personal history of other diseases of the circulatory system: Secondary | ICD-10-CM | POA: Diagnosis not present

## 2017-04-16 DIAGNOSIS — R627 Adult failure to thrive: Secondary | ICD-10-CM | POA: Diagnosis not present

## 2017-04-16 DIAGNOSIS — I639 Cerebral infarction, unspecified: Secondary | ICD-10-CM | POA: Diagnosis not present

## 2017-04-16 DIAGNOSIS — Z8739 Personal history of other diseases of the musculoskeletal system and connective tissue: Secondary | ICD-10-CM | POA: Diagnosis not present

## 2017-04-16 DIAGNOSIS — Z8639 Personal history of other endocrine, nutritional and metabolic disease: Secondary | ICD-10-CM | POA: Diagnosis not present

## 2017-04-16 DIAGNOSIS — Z8679 Personal history of other diseases of the circulatory system: Secondary | ICD-10-CM | POA: Diagnosis not present

## 2017-04-16 DIAGNOSIS — Z8673 Personal history of transient ischemic attack (TIA), and cerebral infarction without residual deficits: Secondary | ICD-10-CM | POA: Diagnosis not present

## 2017-04-19 DIAGNOSIS — I639 Cerebral infarction, unspecified: Secondary | ICD-10-CM | POA: Diagnosis not present

## 2017-04-19 DIAGNOSIS — Z8679 Personal history of other diseases of the circulatory system: Secondary | ICD-10-CM | POA: Diagnosis not present

## 2017-04-19 DIAGNOSIS — R627 Adult failure to thrive: Secondary | ICD-10-CM | POA: Diagnosis not present

## 2017-04-19 DIAGNOSIS — Z8739 Personal history of other diseases of the musculoskeletal system and connective tissue: Secondary | ICD-10-CM | POA: Diagnosis not present

## 2017-04-19 DIAGNOSIS — Z8639 Personal history of other endocrine, nutritional and metabolic disease: Secondary | ICD-10-CM | POA: Diagnosis not present

## 2017-04-19 DIAGNOSIS — Z8673 Personal history of transient ischemic attack (TIA), and cerebral infarction without residual deficits: Secondary | ICD-10-CM | POA: Diagnosis not present

## 2017-04-21 DIAGNOSIS — I639 Cerebral infarction, unspecified: Secondary | ICD-10-CM | POA: Diagnosis not present

## 2017-04-21 DIAGNOSIS — R627 Adult failure to thrive: Secondary | ICD-10-CM | POA: Diagnosis not present

## 2017-04-21 DIAGNOSIS — Z8673 Personal history of transient ischemic attack (TIA), and cerebral infarction without residual deficits: Secondary | ICD-10-CM | POA: Diagnosis not present

## 2017-04-21 DIAGNOSIS — Z8679 Personal history of other diseases of the circulatory system: Secondary | ICD-10-CM | POA: Diagnosis not present

## 2017-04-21 DIAGNOSIS — Z8739 Personal history of other diseases of the musculoskeletal system and connective tissue: Secondary | ICD-10-CM | POA: Diagnosis not present

## 2017-04-21 DIAGNOSIS — Z8639 Personal history of other endocrine, nutritional and metabolic disease: Secondary | ICD-10-CM | POA: Diagnosis not present

## 2017-04-22 DIAGNOSIS — Z8679 Personal history of other diseases of the circulatory system: Secondary | ICD-10-CM | POA: Diagnosis not present

## 2017-04-22 DIAGNOSIS — Z8739 Personal history of other diseases of the musculoskeletal system and connective tissue: Secondary | ICD-10-CM | POA: Diagnosis not present

## 2017-04-22 DIAGNOSIS — Z8673 Personal history of transient ischemic attack (TIA), and cerebral infarction without residual deficits: Secondary | ICD-10-CM | POA: Diagnosis not present

## 2017-04-22 DIAGNOSIS — R627 Adult failure to thrive: Secondary | ICD-10-CM | POA: Diagnosis not present

## 2017-04-22 DIAGNOSIS — Z8639 Personal history of other endocrine, nutritional and metabolic disease: Secondary | ICD-10-CM | POA: Diagnosis not present

## 2017-04-22 DIAGNOSIS — I639 Cerebral infarction, unspecified: Secondary | ICD-10-CM | POA: Diagnosis not present

## 2017-04-23 DIAGNOSIS — I639 Cerebral infarction, unspecified: Secondary | ICD-10-CM | POA: Diagnosis not present

## 2017-04-23 DIAGNOSIS — Z8639 Personal history of other endocrine, nutritional and metabolic disease: Secondary | ICD-10-CM | POA: Diagnosis not present

## 2017-04-23 DIAGNOSIS — Z8679 Personal history of other diseases of the circulatory system: Secondary | ICD-10-CM | POA: Diagnosis not present

## 2017-04-23 DIAGNOSIS — Z8673 Personal history of transient ischemic attack (TIA), and cerebral infarction without residual deficits: Secondary | ICD-10-CM | POA: Diagnosis not present

## 2017-04-23 DIAGNOSIS — Z8739 Personal history of other diseases of the musculoskeletal system and connective tissue: Secondary | ICD-10-CM | POA: Diagnosis not present

## 2017-04-23 DIAGNOSIS — R627 Adult failure to thrive: Secondary | ICD-10-CM | POA: Diagnosis not present

## 2017-04-26 DIAGNOSIS — Z8739 Personal history of other diseases of the musculoskeletal system and connective tissue: Secondary | ICD-10-CM | POA: Diagnosis not present

## 2017-04-26 DIAGNOSIS — I639 Cerebral infarction, unspecified: Secondary | ICD-10-CM | POA: Diagnosis not present

## 2017-04-26 DIAGNOSIS — R627 Adult failure to thrive: Secondary | ICD-10-CM | POA: Diagnosis not present

## 2017-04-26 DIAGNOSIS — Z8673 Personal history of transient ischemic attack (TIA), and cerebral infarction without residual deficits: Secondary | ICD-10-CM | POA: Diagnosis not present

## 2017-04-26 DIAGNOSIS — Z8639 Personal history of other endocrine, nutritional and metabolic disease: Secondary | ICD-10-CM | POA: Diagnosis not present

## 2017-04-26 DIAGNOSIS — Z8679 Personal history of other diseases of the circulatory system: Secondary | ICD-10-CM | POA: Diagnosis not present

## 2017-04-27 DIAGNOSIS — Z8679 Personal history of other diseases of the circulatory system: Secondary | ICD-10-CM | POA: Diagnosis not present

## 2017-04-27 DIAGNOSIS — I639 Cerebral infarction, unspecified: Secondary | ICD-10-CM | POA: Diagnosis not present

## 2017-04-27 DIAGNOSIS — Z8673 Personal history of transient ischemic attack (TIA), and cerebral infarction without residual deficits: Secondary | ICD-10-CM | POA: Diagnosis not present

## 2017-04-27 DIAGNOSIS — Z8739 Personal history of other diseases of the musculoskeletal system and connective tissue: Secondary | ICD-10-CM | POA: Diagnosis not present

## 2017-04-27 DIAGNOSIS — Z8639 Personal history of other endocrine, nutritional and metabolic disease: Secondary | ICD-10-CM | POA: Diagnosis not present

## 2017-04-27 DIAGNOSIS — R627 Adult failure to thrive: Secondary | ICD-10-CM | POA: Diagnosis not present

## 2017-04-28 DIAGNOSIS — R627 Adult failure to thrive: Secondary | ICD-10-CM | POA: Diagnosis not present

## 2017-04-28 DIAGNOSIS — Z8639 Personal history of other endocrine, nutritional and metabolic disease: Secondary | ICD-10-CM | POA: Diagnosis not present

## 2017-04-28 DIAGNOSIS — Z8679 Personal history of other diseases of the circulatory system: Secondary | ICD-10-CM | POA: Diagnosis not present

## 2017-04-28 DIAGNOSIS — Z8739 Personal history of other diseases of the musculoskeletal system and connective tissue: Secondary | ICD-10-CM | POA: Diagnosis not present

## 2017-04-28 DIAGNOSIS — Z8673 Personal history of transient ischemic attack (TIA), and cerebral infarction without residual deficits: Secondary | ICD-10-CM | POA: Diagnosis not present

## 2017-04-28 DIAGNOSIS — I639 Cerebral infarction, unspecified: Secondary | ICD-10-CM | POA: Diagnosis not present

## 2017-04-29 DIAGNOSIS — I639 Cerebral infarction, unspecified: Secondary | ICD-10-CM | POA: Diagnosis not present

## 2017-04-29 DIAGNOSIS — Z8679 Personal history of other diseases of the circulatory system: Secondary | ICD-10-CM | POA: Diagnosis not present

## 2017-04-29 DIAGNOSIS — Z8673 Personal history of transient ischemic attack (TIA), and cerebral infarction without residual deficits: Secondary | ICD-10-CM | POA: Diagnosis not present

## 2017-04-29 DIAGNOSIS — R627 Adult failure to thrive: Secondary | ICD-10-CM | POA: Diagnosis not present

## 2017-04-29 DIAGNOSIS — Z8639 Personal history of other endocrine, nutritional and metabolic disease: Secondary | ICD-10-CM | POA: Diagnosis not present

## 2017-04-29 DIAGNOSIS — Z8739 Personal history of other diseases of the musculoskeletal system and connective tissue: Secondary | ICD-10-CM | POA: Diagnosis not present

## 2017-04-30 DIAGNOSIS — Z8739 Personal history of other diseases of the musculoskeletal system and connective tissue: Secondary | ICD-10-CM | POA: Diagnosis not present

## 2017-04-30 DIAGNOSIS — Z8673 Personal history of transient ischemic attack (TIA), and cerebral infarction without residual deficits: Secondary | ICD-10-CM | POA: Diagnosis not present

## 2017-04-30 DIAGNOSIS — R627 Adult failure to thrive: Secondary | ICD-10-CM | POA: Diagnosis not present

## 2017-04-30 DIAGNOSIS — Z8679 Personal history of other diseases of the circulatory system: Secondary | ICD-10-CM | POA: Diagnosis not present

## 2017-04-30 DIAGNOSIS — I639 Cerebral infarction, unspecified: Secondary | ICD-10-CM | POA: Diagnosis not present

## 2017-04-30 DIAGNOSIS — Z8639 Personal history of other endocrine, nutritional and metabolic disease: Secondary | ICD-10-CM | POA: Diagnosis not present

## 2017-05-02 DIAGNOSIS — Z8639 Personal history of other endocrine, nutritional and metabolic disease: Secondary | ICD-10-CM | POA: Diagnosis not present

## 2017-05-02 DIAGNOSIS — Z8679 Personal history of other diseases of the circulatory system: Secondary | ICD-10-CM | POA: Diagnosis not present

## 2017-05-02 DIAGNOSIS — Z8739 Personal history of other diseases of the musculoskeletal system and connective tissue: Secondary | ICD-10-CM | POA: Diagnosis not present

## 2017-05-02 DIAGNOSIS — I639 Cerebral infarction, unspecified: Secondary | ICD-10-CM | POA: Diagnosis not present

## 2017-05-02 DIAGNOSIS — Z8673 Personal history of transient ischemic attack (TIA), and cerebral infarction without residual deficits: Secondary | ICD-10-CM | POA: Diagnosis not present

## 2017-05-02 DIAGNOSIS — R627 Adult failure to thrive: Secondary | ICD-10-CM | POA: Diagnosis not present

## 2017-05-03 DIAGNOSIS — I639 Cerebral infarction, unspecified: Secondary | ICD-10-CM | POA: Diagnosis not present

## 2017-05-03 DIAGNOSIS — R627 Adult failure to thrive: Secondary | ICD-10-CM | POA: Diagnosis not present

## 2017-05-03 DIAGNOSIS — Z8739 Personal history of other diseases of the musculoskeletal system and connective tissue: Secondary | ICD-10-CM | POA: Diagnosis not present

## 2017-05-03 DIAGNOSIS — Z8679 Personal history of other diseases of the circulatory system: Secondary | ICD-10-CM | POA: Diagnosis not present

## 2017-05-03 DIAGNOSIS — Z8639 Personal history of other endocrine, nutritional and metabolic disease: Secondary | ICD-10-CM | POA: Diagnosis not present

## 2017-05-03 DIAGNOSIS — Z8673 Personal history of transient ischemic attack (TIA), and cerebral infarction without residual deficits: Secondary | ICD-10-CM | POA: Diagnosis not present

## 2017-05-04 DIAGNOSIS — Z8673 Personal history of transient ischemic attack (TIA), and cerebral infarction without residual deficits: Secondary | ICD-10-CM | POA: Diagnosis not present

## 2017-05-04 DIAGNOSIS — Z8639 Personal history of other endocrine, nutritional and metabolic disease: Secondary | ICD-10-CM | POA: Diagnosis not present

## 2017-05-04 DIAGNOSIS — I639 Cerebral infarction, unspecified: Secondary | ICD-10-CM | POA: Diagnosis not present

## 2017-05-04 DIAGNOSIS — R627 Adult failure to thrive: Secondary | ICD-10-CM | POA: Diagnosis not present

## 2017-05-04 DIAGNOSIS — Z8739 Personal history of other diseases of the musculoskeletal system and connective tissue: Secondary | ICD-10-CM | POA: Diagnosis not present

## 2017-05-04 DIAGNOSIS — Z8679 Personal history of other diseases of the circulatory system: Secondary | ICD-10-CM | POA: Diagnosis not present

## 2017-05-05 DIAGNOSIS — Z8739 Personal history of other diseases of the musculoskeletal system and connective tissue: Secondary | ICD-10-CM | POA: Diagnosis not present

## 2017-05-05 DIAGNOSIS — Z8639 Personal history of other endocrine, nutritional and metabolic disease: Secondary | ICD-10-CM | POA: Diagnosis not present

## 2017-05-05 DIAGNOSIS — R627 Adult failure to thrive: Secondary | ICD-10-CM | POA: Diagnosis not present

## 2017-05-05 DIAGNOSIS — I639 Cerebral infarction, unspecified: Secondary | ICD-10-CM | POA: Diagnosis not present

## 2017-05-05 DIAGNOSIS — Z8673 Personal history of transient ischemic attack (TIA), and cerebral infarction without residual deficits: Secondary | ICD-10-CM | POA: Diagnosis not present

## 2017-05-05 DIAGNOSIS — Z8679 Personal history of other diseases of the circulatory system: Secondary | ICD-10-CM | POA: Diagnosis not present

## 2017-05-07 DIAGNOSIS — Z8639 Personal history of other endocrine, nutritional and metabolic disease: Secondary | ICD-10-CM | POA: Diagnosis not present

## 2017-05-07 DIAGNOSIS — I639 Cerebral infarction, unspecified: Secondary | ICD-10-CM | POA: Diagnosis not present

## 2017-05-07 DIAGNOSIS — Z8739 Personal history of other diseases of the musculoskeletal system and connective tissue: Secondary | ICD-10-CM | POA: Diagnosis not present

## 2017-05-07 DIAGNOSIS — Z8673 Personal history of transient ischemic attack (TIA), and cerebral infarction without residual deficits: Secondary | ICD-10-CM | POA: Diagnosis not present

## 2017-05-07 DIAGNOSIS — Z8679 Personal history of other diseases of the circulatory system: Secondary | ICD-10-CM | POA: Diagnosis not present

## 2017-05-07 DIAGNOSIS — R627 Adult failure to thrive: Secondary | ICD-10-CM | POA: Diagnosis not present

## 2017-05-11 DIAGNOSIS — Z8739 Personal history of other diseases of the musculoskeletal system and connective tissue: Secondary | ICD-10-CM | POA: Diagnosis not present

## 2017-05-11 DIAGNOSIS — Z8679 Personal history of other diseases of the circulatory system: Secondary | ICD-10-CM | POA: Diagnosis not present

## 2017-05-11 DIAGNOSIS — I639 Cerebral infarction, unspecified: Secondary | ICD-10-CM | POA: Diagnosis not present

## 2017-05-11 DIAGNOSIS — Z8639 Personal history of other endocrine, nutritional and metabolic disease: Secondary | ICD-10-CM | POA: Diagnosis not present

## 2017-05-11 DIAGNOSIS — Z8673 Personal history of transient ischemic attack (TIA), and cerebral infarction without residual deficits: Secondary | ICD-10-CM | POA: Diagnosis not present

## 2017-05-11 DIAGNOSIS — R627 Adult failure to thrive: Secondary | ICD-10-CM | POA: Diagnosis not present

## 2017-05-12 DIAGNOSIS — Z8719 Personal history of other diseases of the digestive system: Secondary | ICD-10-CM | POA: Diagnosis not present

## 2017-05-12 DIAGNOSIS — Z8669 Personal history of other diseases of the nervous system and sense organs: Secondary | ICD-10-CM | POA: Diagnosis not present

## 2017-05-12 DIAGNOSIS — I639 Cerebral infarction, unspecified: Secondary | ICD-10-CM | POA: Diagnosis not present

## 2017-05-12 DIAGNOSIS — Z8739 Personal history of other diseases of the musculoskeletal system and connective tissue: Secondary | ICD-10-CM | POA: Diagnosis not present

## 2017-05-12 DIAGNOSIS — Z8679 Personal history of other diseases of the circulatory system: Secondary | ICD-10-CM | POA: Diagnosis not present

## 2017-05-12 DIAGNOSIS — R627 Adult failure to thrive: Secondary | ICD-10-CM | POA: Diagnosis not present

## 2017-05-12 DIAGNOSIS — Z8673 Personal history of transient ischemic attack (TIA), and cerebral infarction without residual deficits: Secondary | ICD-10-CM | POA: Diagnosis not present

## 2017-05-12 DIAGNOSIS — Z8639 Personal history of other endocrine, nutritional and metabolic disease: Secondary | ICD-10-CM | POA: Diagnosis not present

## 2017-05-13 DIAGNOSIS — Z8739 Personal history of other diseases of the musculoskeletal system and connective tissue: Secondary | ICD-10-CM | POA: Diagnosis not present

## 2017-05-13 DIAGNOSIS — Z8673 Personal history of transient ischemic attack (TIA), and cerebral infarction without residual deficits: Secondary | ICD-10-CM | POA: Diagnosis not present

## 2017-05-13 DIAGNOSIS — Z8679 Personal history of other diseases of the circulatory system: Secondary | ICD-10-CM | POA: Diagnosis not present

## 2017-05-13 DIAGNOSIS — I639 Cerebral infarction, unspecified: Secondary | ICD-10-CM | POA: Diagnosis not present

## 2017-05-13 DIAGNOSIS — R627 Adult failure to thrive: Secondary | ICD-10-CM | POA: Diagnosis not present

## 2017-05-13 DIAGNOSIS — Z8639 Personal history of other endocrine, nutritional and metabolic disease: Secondary | ICD-10-CM | POA: Diagnosis not present

## 2017-05-14 DIAGNOSIS — Z8639 Personal history of other endocrine, nutritional and metabolic disease: Secondary | ICD-10-CM | POA: Diagnosis not present

## 2017-05-14 DIAGNOSIS — Z8739 Personal history of other diseases of the musculoskeletal system and connective tissue: Secondary | ICD-10-CM | POA: Diagnosis not present

## 2017-05-14 DIAGNOSIS — Z8679 Personal history of other diseases of the circulatory system: Secondary | ICD-10-CM | POA: Diagnosis not present

## 2017-05-14 DIAGNOSIS — I639 Cerebral infarction, unspecified: Secondary | ICD-10-CM | POA: Diagnosis not present

## 2017-05-14 DIAGNOSIS — Z8673 Personal history of transient ischemic attack (TIA), and cerebral infarction without residual deficits: Secondary | ICD-10-CM | POA: Diagnosis not present

## 2017-05-14 DIAGNOSIS — R627 Adult failure to thrive: Secondary | ICD-10-CM | POA: Diagnosis not present

## 2017-05-17 DIAGNOSIS — I639 Cerebral infarction, unspecified: Secondary | ICD-10-CM | POA: Diagnosis not present

## 2017-05-17 DIAGNOSIS — Z8739 Personal history of other diseases of the musculoskeletal system and connective tissue: Secondary | ICD-10-CM | POA: Diagnosis not present

## 2017-05-17 DIAGNOSIS — Z8679 Personal history of other diseases of the circulatory system: Secondary | ICD-10-CM | POA: Diagnosis not present

## 2017-05-17 DIAGNOSIS — Z8639 Personal history of other endocrine, nutritional and metabolic disease: Secondary | ICD-10-CM | POA: Diagnosis not present

## 2017-05-17 DIAGNOSIS — Z8673 Personal history of transient ischemic attack (TIA), and cerebral infarction without residual deficits: Secondary | ICD-10-CM | POA: Diagnosis not present

## 2017-05-17 DIAGNOSIS — R627 Adult failure to thrive: Secondary | ICD-10-CM | POA: Diagnosis not present

## 2017-05-19 DIAGNOSIS — Z8739 Personal history of other diseases of the musculoskeletal system and connective tissue: Secondary | ICD-10-CM | POA: Diagnosis not present

## 2017-05-19 DIAGNOSIS — R627 Adult failure to thrive: Secondary | ICD-10-CM | POA: Diagnosis not present

## 2017-05-19 DIAGNOSIS — I639 Cerebral infarction, unspecified: Secondary | ICD-10-CM | POA: Diagnosis not present

## 2017-05-19 DIAGNOSIS — Z8639 Personal history of other endocrine, nutritional and metabolic disease: Secondary | ICD-10-CM | POA: Diagnosis not present

## 2017-05-19 DIAGNOSIS — Z8673 Personal history of transient ischemic attack (TIA), and cerebral infarction without residual deficits: Secondary | ICD-10-CM | POA: Diagnosis not present

## 2017-05-19 DIAGNOSIS — Z8679 Personal history of other diseases of the circulatory system: Secondary | ICD-10-CM | POA: Diagnosis not present

## 2017-05-21 DIAGNOSIS — R627 Adult failure to thrive: Secondary | ICD-10-CM | POA: Diagnosis not present

## 2017-05-21 DIAGNOSIS — Z8639 Personal history of other endocrine, nutritional and metabolic disease: Secondary | ICD-10-CM | POA: Diagnosis not present

## 2017-05-21 DIAGNOSIS — Z8739 Personal history of other diseases of the musculoskeletal system and connective tissue: Secondary | ICD-10-CM | POA: Diagnosis not present

## 2017-05-21 DIAGNOSIS — Z8673 Personal history of transient ischemic attack (TIA), and cerebral infarction without residual deficits: Secondary | ICD-10-CM | POA: Diagnosis not present

## 2017-05-21 DIAGNOSIS — Z8679 Personal history of other diseases of the circulatory system: Secondary | ICD-10-CM | POA: Diagnosis not present

## 2017-05-21 DIAGNOSIS — I639 Cerebral infarction, unspecified: Secondary | ICD-10-CM | POA: Diagnosis not present

## 2017-05-24 DIAGNOSIS — Z8639 Personal history of other endocrine, nutritional and metabolic disease: Secondary | ICD-10-CM | POA: Diagnosis not present

## 2017-05-24 DIAGNOSIS — Z8679 Personal history of other diseases of the circulatory system: Secondary | ICD-10-CM | POA: Diagnosis not present

## 2017-05-24 DIAGNOSIS — Z8673 Personal history of transient ischemic attack (TIA), and cerebral infarction without residual deficits: Secondary | ICD-10-CM | POA: Diagnosis not present

## 2017-05-24 DIAGNOSIS — Z8739 Personal history of other diseases of the musculoskeletal system and connective tissue: Secondary | ICD-10-CM | POA: Diagnosis not present

## 2017-05-24 DIAGNOSIS — I639 Cerebral infarction, unspecified: Secondary | ICD-10-CM | POA: Diagnosis not present

## 2017-05-24 DIAGNOSIS — R627 Adult failure to thrive: Secondary | ICD-10-CM | POA: Diagnosis not present

## 2017-05-25 DIAGNOSIS — I639 Cerebral infarction, unspecified: Secondary | ICD-10-CM | POA: Diagnosis not present

## 2017-05-25 DIAGNOSIS — Z8739 Personal history of other diseases of the musculoskeletal system and connective tissue: Secondary | ICD-10-CM | POA: Diagnosis not present

## 2017-05-25 DIAGNOSIS — Z8679 Personal history of other diseases of the circulatory system: Secondary | ICD-10-CM | POA: Diagnosis not present

## 2017-05-25 DIAGNOSIS — Z8673 Personal history of transient ischemic attack (TIA), and cerebral infarction without residual deficits: Secondary | ICD-10-CM | POA: Diagnosis not present

## 2017-05-25 DIAGNOSIS — R627 Adult failure to thrive: Secondary | ICD-10-CM | POA: Diagnosis not present

## 2017-05-25 DIAGNOSIS — Z8639 Personal history of other endocrine, nutritional and metabolic disease: Secondary | ICD-10-CM | POA: Diagnosis not present

## 2017-05-26 DIAGNOSIS — R627 Adult failure to thrive: Secondary | ICD-10-CM | POA: Diagnosis not present

## 2017-05-26 DIAGNOSIS — Z8639 Personal history of other endocrine, nutritional and metabolic disease: Secondary | ICD-10-CM | POA: Diagnosis not present

## 2017-05-26 DIAGNOSIS — I639 Cerebral infarction, unspecified: Secondary | ICD-10-CM | POA: Diagnosis not present

## 2017-05-26 DIAGNOSIS — Z8673 Personal history of transient ischemic attack (TIA), and cerebral infarction without residual deficits: Secondary | ICD-10-CM | POA: Diagnosis not present

## 2017-05-26 DIAGNOSIS — Z8679 Personal history of other diseases of the circulatory system: Secondary | ICD-10-CM | POA: Diagnosis not present

## 2017-05-26 DIAGNOSIS — Z8739 Personal history of other diseases of the musculoskeletal system and connective tissue: Secondary | ICD-10-CM | POA: Diagnosis not present

## 2017-05-27 DIAGNOSIS — Z8673 Personal history of transient ischemic attack (TIA), and cerebral infarction without residual deficits: Secondary | ICD-10-CM | POA: Diagnosis not present

## 2017-05-27 DIAGNOSIS — Z8739 Personal history of other diseases of the musculoskeletal system and connective tissue: Secondary | ICD-10-CM | POA: Diagnosis not present

## 2017-05-27 DIAGNOSIS — I639 Cerebral infarction, unspecified: Secondary | ICD-10-CM | POA: Diagnosis not present

## 2017-05-27 DIAGNOSIS — R627 Adult failure to thrive: Secondary | ICD-10-CM | POA: Diagnosis not present

## 2017-05-27 DIAGNOSIS — Z8679 Personal history of other diseases of the circulatory system: Secondary | ICD-10-CM | POA: Diagnosis not present

## 2017-05-27 DIAGNOSIS — Z8639 Personal history of other endocrine, nutritional and metabolic disease: Secondary | ICD-10-CM | POA: Diagnosis not present

## 2017-05-31 DIAGNOSIS — R627 Adult failure to thrive: Secondary | ICD-10-CM | POA: Diagnosis not present

## 2017-05-31 DIAGNOSIS — Z8639 Personal history of other endocrine, nutritional and metabolic disease: Secondary | ICD-10-CM | POA: Diagnosis not present

## 2017-05-31 DIAGNOSIS — Z8739 Personal history of other diseases of the musculoskeletal system and connective tissue: Secondary | ICD-10-CM | POA: Diagnosis not present

## 2017-05-31 DIAGNOSIS — I639 Cerebral infarction, unspecified: Secondary | ICD-10-CM | POA: Diagnosis not present

## 2017-05-31 DIAGNOSIS — Z8673 Personal history of transient ischemic attack (TIA), and cerebral infarction without residual deficits: Secondary | ICD-10-CM | POA: Diagnosis not present

## 2017-05-31 DIAGNOSIS — Z8679 Personal history of other diseases of the circulatory system: Secondary | ICD-10-CM | POA: Diagnosis not present

## 2017-06-02 DIAGNOSIS — Z8679 Personal history of other diseases of the circulatory system: Secondary | ICD-10-CM | POA: Diagnosis not present

## 2017-06-02 DIAGNOSIS — Z8739 Personal history of other diseases of the musculoskeletal system and connective tissue: Secondary | ICD-10-CM | POA: Diagnosis not present

## 2017-06-02 DIAGNOSIS — R627 Adult failure to thrive: Secondary | ICD-10-CM | POA: Diagnosis not present

## 2017-06-02 DIAGNOSIS — Z8673 Personal history of transient ischemic attack (TIA), and cerebral infarction without residual deficits: Secondary | ICD-10-CM | POA: Diagnosis not present

## 2017-06-02 DIAGNOSIS — I639 Cerebral infarction, unspecified: Secondary | ICD-10-CM | POA: Diagnosis not present

## 2017-06-02 DIAGNOSIS — Z8639 Personal history of other endocrine, nutritional and metabolic disease: Secondary | ICD-10-CM | POA: Diagnosis not present

## 2017-06-03 DIAGNOSIS — I639 Cerebral infarction, unspecified: Secondary | ICD-10-CM | POA: Diagnosis not present

## 2017-06-03 DIAGNOSIS — Z8739 Personal history of other diseases of the musculoskeletal system and connective tissue: Secondary | ICD-10-CM | POA: Diagnosis not present

## 2017-06-03 DIAGNOSIS — R627 Adult failure to thrive: Secondary | ICD-10-CM | POA: Diagnosis not present

## 2017-06-03 DIAGNOSIS — Z8679 Personal history of other diseases of the circulatory system: Secondary | ICD-10-CM | POA: Diagnosis not present

## 2017-06-03 DIAGNOSIS — Z8639 Personal history of other endocrine, nutritional and metabolic disease: Secondary | ICD-10-CM | POA: Diagnosis not present

## 2017-06-03 DIAGNOSIS — Z8673 Personal history of transient ischemic attack (TIA), and cerebral infarction without residual deficits: Secondary | ICD-10-CM | POA: Diagnosis not present

## 2017-06-04 DIAGNOSIS — I639 Cerebral infarction, unspecified: Secondary | ICD-10-CM | POA: Diagnosis not present

## 2017-06-04 DIAGNOSIS — Z8639 Personal history of other endocrine, nutritional and metabolic disease: Secondary | ICD-10-CM | POA: Diagnosis not present

## 2017-06-04 DIAGNOSIS — Z8739 Personal history of other diseases of the musculoskeletal system and connective tissue: Secondary | ICD-10-CM | POA: Diagnosis not present

## 2017-06-04 DIAGNOSIS — Z8679 Personal history of other diseases of the circulatory system: Secondary | ICD-10-CM | POA: Diagnosis not present

## 2017-06-04 DIAGNOSIS — R627 Adult failure to thrive: Secondary | ICD-10-CM | POA: Diagnosis not present

## 2017-06-04 DIAGNOSIS — Z8673 Personal history of transient ischemic attack (TIA), and cerebral infarction without residual deficits: Secondary | ICD-10-CM | POA: Diagnosis not present

## 2017-06-07 DIAGNOSIS — I639 Cerebral infarction, unspecified: Secondary | ICD-10-CM | POA: Diagnosis not present

## 2017-06-07 DIAGNOSIS — Z8639 Personal history of other endocrine, nutritional and metabolic disease: Secondary | ICD-10-CM | POA: Diagnosis not present

## 2017-06-07 DIAGNOSIS — R627 Adult failure to thrive: Secondary | ICD-10-CM | POA: Diagnosis not present

## 2017-06-07 DIAGNOSIS — Z8679 Personal history of other diseases of the circulatory system: Secondary | ICD-10-CM | POA: Diagnosis not present

## 2017-06-07 DIAGNOSIS — Z8673 Personal history of transient ischemic attack (TIA), and cerebral infarction without residual deficits: Secondary | ICD-10-CM | POA: Diagnosis not present

## 2017-06-07 DIAGNOSIS — Z8739 Personal history of other diseases of the musculoskeletal system and connective tissue: Secondary | ICD-10-CM | POA: Diagnosis not present

## 2017-06-09 DIAGNOSIS — Z8679 Personal history of other diseases of the circulatory system: Secondary | ICD-10-CM | POA: Diagnosis not present

## 2017-06-09 DIAGNOSIS — I639 Cerebral infarction, unspecified: Secondary | ICD-10-CM | POA: Diagnosis not present

## 2017-06-09 DIAGNOSIS — Z8739 Personal history of other diseases of the musculoskeletal system and connective tissue: Secondary | ICD-10-CM | POA: Diagnosis not present

## 2017-06-09 DIAGNOSIS — Z8639 Personal history of other endocrine, nutritional and metabolic disease: Secondary | ICD-10-CM | POA: Diagnosis not present

## 2017-06-09 DIAGNOSIS — Z8673 Personal history of transient ischemic attack (TIA), and cerebral infarction without residual deficits: Secondary | ICD-10-CM | POA: Diagnosis not present

## 2017-06-09 DIAGNOSIS — R627 Adult failure to thrive: Secondary | ICD-10-CM | POA: Diagnosis not present

## 2017-06-11 DIAGNOSIS — Z8679 Personal history of other diseases of the circulatory system: Secondary | ICD-10-CM | POA: Diagnosis not present

## 2017-06-11 DIAGNOSIS — Z8739 Personal history of other diseases of the musculoskeletal system and connective tissue: Secondary | ICD-10-CM | POA: Diagnosis not present

## 2017-06-11 DIAGNOSIS — Z8639 Personal history of other endocrine, nutritional and metabolic disease: Secondary | ICD-10-CM | POA: Diagnosis not present

## 2017-06-11 DIAGNOSIS — I639 Cerebral infarction, unspecified: Secondary | ICD-10-CM | POA: Diagnosis not present

## 2017-06-11 DIAGNOSIS — R627 Adult failure to thrive: Secondary | ICD-10-CM | POA: Diagnosis not present

## 2017-06-11 DIAGNOSIS — Z8673 Personal history of transient ischemic attack (TIA), and cerebral infarction without residual deficits: Secondary | ICD-10-CM | POA: Diagnosis not present

## 2017-06-12 DIAGNOSIS — Z8639 Personal history of other endocrine, nutritional and metabolic disease: Secondary | ICD-10-CM | POA: Diagnosis not present

## 2017-06-12 DIAGNOSIS — Z8673 Personal history of transient ischemic attack (TIA), and cerebral infarction without residual deficits: Secondary | ICD-10-CM | POA: Diagnosis not present

## 2017-06-12 DIAGNOSIS — Z8679 Personal history of other diseases of the circulatory system: Secondary | ICD-10-CM | POA: Diagnosis not present

## 2017-06-12 DIAGNOSIS — Z8669 Personal history of other diseases of the nervous system and sense organs: Secondary | ICD-10-CM | POA: Diagnosis not present

## 2017-06-12 DIAGNOSIS — Z8739 Personal history of other diseases of the musculoskeletal system and connective tissue: Secondary | ICD-10-CM | POA: Diagnosis not present

## 2017-06-12 DIAGNOSIS — R627 Adult failure to thrive: Secondary | ICD-10-CM | POA: Diagnosis not present

## 2017-06-12 DIAGNOSIS — Z8719 Personal history of other diseases of the digestive system: Secondary | ICD-10-CM | POA: Diagnosis not present

## 2017-06-12 DIAGNOSIS — I639 Cerebral infarction, unspecified: Secondary | ICD-10-CM | POA: Diagnosis not present

## 2017-06-15 DIAGNOSIS — Z8639 Personal history of other endocrine, nutritional and metabolic disease: Secondary | ICD-10-CM | POA: Diagnosis not present

## 2017-06-15 DIAGNOSIS — Z8673 Personal history of transient ischemic attack (TIA), and cerebral infarction without residual deficits: Secondary | ICD-10-CM | POA: Diagnosis not present

## 2017-06-15 DIAGNOSIS — Z8679 Personal history of other diseases of the circulatory system: Secondary | ICD-10-CM | POA: Diagnosis not present

## 2017-06-15 DIAGNOSIS — Z8739 Personal history of other diseases of the musculoskeletal system and connective tissue: Secondary | ICD-10-CM | POA: Diagnosis not present

## 2017-06-15 DIAGNOSIS — I639 Cerebral infarction, unspecified: Secondary | ICD-10-CM | POA: Diagnosis not present

## 2017-06-15 DIAGNOSIS — R627 Adult failure to thrive: Secondary | ICD-10-CM | POA: Diagnosis not present

## 2017-06-16 DIAGNOSIS — Z8639 Personal history of other endocrine, nutritional and metabolic disease: Secondary | ICD-10-CM | POA: Diagnosis not present

## 2017-06-16 DIAGNOSIS — Z8739 Personal history of other diseases of the musculoskeletal system and connective tissue: Secondary | ICD-10-CM | POA: Diagnosis not present

## 2017-06-16 DIAGNOSIS — R627 Adult failure to thrive: Secondary | ICD-10-CM | POA: Diagnosis not present

## 2017-06-16 DIAGNOSIS — Z8679 Personal history of other diseases of the circulatory system: Secondary | ICD-10-CM | POA: Diagnosis not present

## 2017-06-16 DIAGNOSIS — Z8673 Personal history of transient ischemic attack (TIA), and cerebral infarction without residual deficits: Secondary | ICD-10-CM | POA: Diagnosis not present

## 2017-06-16 DIAGNOSIS — I639 Cerebral infarction, unspecified: Secondary | ICD-10-CM | POA: Diagnosis not present

## 2017-06-18 DIAGNOSIS — Z8673 Personal history of transient ischemic attack (TIA), and cerebral infarction without residual deficits: Secondary | ICD-10-CM | POA: Diagnosis not present

## 2017-06-18 DIAGNOSIS — Z8679 Personal history of other diseases of the circulatory system: Secondary | ICD-10-CM | POA: Diagnosis not present

## 2017-06-18 DIAGNOSIS — R627 Adult failure to thrive: Secondary | ICD-10-CM | POA: Diagnosis not present

## 2017-06-18 DIAGNOSIS — Z8739 Personal history of other diseases of the musculoskeletal system and connective tissue: Secondary | ICD-10-CM | POA: Diagnosis not present

## 2017-06-18 DIAGNOSIS — Z8639 Personal history of other endocrine, nutritional and metabolic disease: Secondary | ICD-10-CM | POA: Diagnosis not present

## 2017-06-18 DIAGNOSIS — I639 Cerebral infarction, unspecified: Secondary | ICD-10-CM | POA: Diagnosis not present

## 2017-06-21 DIAGNOSIS — Z8679 Personal history of other diseases of the circulatory system: Secondary | ICD-10-CM | POA: Diagnosis not present

## 2017-06-21 DIAGNOSIS — Z8673 Personal history of transient ischemic attack (TIA), and cerebral infarction without residual deficits: Secondary | ICD-10-CM | POA: Diagnosis not present

## 2017-06-21 DIAGNOSIS — I639 Cerebral infarction, unspecified: Secondary | ICD-10-CM | POA: Diagnosis not present

## 2017-06-21 DIAGNOSIS — R627 Adult failure to thrive: Secondary | ICD-10-CM | POA: Diagnosis not present

## 2017-06-21 DIAGNOSIS — Z8639 Personal history of other endocrine, nutritional and metabolic disease: Secondary | ICD-10-CM | POA: Diagnosis not present

## 2017-06-21 DIAGNOSIS — Z8739 Personal history of other diseases of the musculoskeletal system and connective tissue: Secondary | ICD-10-CM | POA: Diagnosis not present

## 2017-06-22 DIAGNOSIS — Z8679 Personal history of other diseases of the circulatory system: Secondary | ICD-10-CM | POA: Diagnosis not present

## 2017-06-22 DIAGNOSIS — I639 Cerebral infarction, unspecified: Secondary | ICD-10-CM | POA: Diagnosis not present

## 2017-06-22 DIAGNOSIS — Z8673 Personal history of transient ischemic attack (TIA), and cerebral infarction without residual deficits: Secondary | ICD-10-CM | POA: Diagnosis not present

## 2017-06-22 DIAGNOSIS — R627 Adult failure to thrive: Secondary | ICD-10-CM | POA: Diagnosis not present

## 2017-06-22 DIAGNOSIS — Z8739 Personal history of other diseases of the musculoskeletal system and connective tissue: Secondary | ICD-10-CM | POA: Diagnosis not present

## 2017-06-22 DIAGNOSIS — Z8639 Personal history of other endocrine, nutritional and metabolic disease: Secondary | ICD-10-CM | POA: Diagnosis not present

## 2017-06-23 DIAGNOSIS — R627 Adult failure to thrive: Secondary | ICD-10-CM | POA: Diagnosis not present

## 2017-06-23 DIAGNOSIS — Z8639 Personal history of other endocrine, nutritional and metabolic disease: Secondary | ICD-10-CM | POA: Diagnosis not present

## 2017-06-23 DIAGNOSIS — Z8673 Personal history of transient ischemic attack (TIA), and cerebral infarction without residual deficits: Secondary | ICD-10-CM | POA: Diagnosis not present

## 2017-06-23 DIAGNOSIS — Z8739 Personal history of other diseases of the musculoskeletal system and connective tissue: Secondary | ICD-10-CM | POA: Diagnosis not present

## 2017-06-23 DIAGNOSIS — Z8679 Personal history of other diseases of the circulatory system: Secondary | ICD-10-CM | POA: Diagnosis not present

## 2017-06-23 DIAGNOSIS — I639 Cerebral infarction, unspecified: Secondary | ICD-10-CM | POA: Diagnosis not present

## 2017-06-24 DIAGNOSIS — R627 Adult failure to thrive: Secondary | ICD-10-CM | POA: Diagnosis not present

## 2017-06-24 DIAGNOSIS — Z8679 Personal history of other diseases of the circulatory system: Secondary | ICD-10-CM | POA: Diagnosis not present

## 2017-06-24 DIAGNOSIS — Z8673 Personal history of transient ischemic attack (TIA), and cerebral infarction without residual deficits: Secondary | ICD-10-CM | POA: Diagnosis not present

## 2017-06-24 DIAGNOSIS — Z8639 Personal history of other endocrine, nutritional and metabolic disease: Secondary | ICD-10-CM | POA: Diagnosis not present

## 2017-06-24 DIAGNOSIS — Z8739 Personal history of other diseases of the musculoskeletal system and connective tissue: Secondary | ICD-10-CM | POA: Diagnosis not present

## 2017-06-24 DIAGNOSIS — I639 Cerebral infarction, unspecified: Secondary | ICD-10-CM | POA: Diagnosis not present

## 2017-06-25 DIAGNOSIS — I639 Cerebral infarction, unspecified: Secondary | ICD-10-CM | POA: Diagnosis not present

## 2017-06-25 DIAGNOSIS — Z8679 Personal history of other diseases of the circulatory system: Secondary | ICD-10-CM | POA: Diagnosis not present

## 2017-06-25 DIAGNOSIS — Z8639 Personal history of other endocrine, nutritional and metabolic disease: Secondary | ICD-10-CM | POA: Diagnosis not present

## 2017-06-25 DIAGNOSIS — Z8673 Personal history of transient ischemic attack (TIA), and cerebral infarction without residual deficits: Secondary | ICD-10-CM | POA: Diagnosis not present

## 2017-06-25 DIAGNOSIS — R627 Adult failure to thrive: Secondary | ICD-10-CM | POA: Diagnosis not present

## 2017-06-25 DIAGNOSIS — Z8739 Personal history of other diseases of the musculoskeletal system and connective tissue: Secondary | ICD-10-CM | POA: Diagnosis not present

## 2017-06-28 DIAGNOSIS — R627 Adult failure to thrive: Secondary | ICD-10-CM | POA: Diagnosis not present

## 2017-06-28 DIAGNOSIS — Z8739 Personal history of other diseases of the musculoskeletal system and connective tissue: Secondary | ICD-10-CM | POA: Diagnosis not present

## 2017-06-28 DIAGNOSIS — I639 Cerebral infarction, unspecified: Secondary | ICD-10-CM | POA: Diagnosis not present

## 2017-06-28 DIAGNOSIS — Z8673 Personal history of transient ischemic attack (TIA), and cerebral infarction without residual deficits: Secondary | ICD-10-CM | POA: Diagnosis not present

## 2017-06-28 DIAGNOSIS — Z8679 Personal history of other diseases of the circulatory system: Secondary | ICD-10-CM | POA: Diagnosis not present

## 2017-06-28 DIAGNOSIS — Z8639 Personal history of other endocrine, nutritional and metabolic disease: Secondary | ICD-10-CM | POA: Diagnosis not present

## 2017-06-29 DIAGNOSIS — Z8673 Personal history of transient ischemic attack (TIA), and cerebral infarction without residual deficits: Secondary | ICD-10-CM | POA: Diagnosis not present

## 2017-06-29 DIAGNOSIS — Z8739 Personal history of other diseases of the musculoskeletal system and connective tissue: Secondary | ICD-10-CM | POA: Diagnosis not present

## 2017-06-29 DIAGNOSIS — I639 Cerebral infarction, unspecified: Secondary | ICD-10-CM | POA: Diagnosis not present

## 2017-06-29 DIAGNOSIS — Z8639 Personal history of other endocrine, nutritional and metabolic disease: Secondary | ICD-10-CM | POA: Diagnosis not present

## 2017-06-29 DIAGNOSIS — Z8679 Personal history of other diseases of the circulatory system: Secondary | ICD-10-CM | POA: Diagnosis not present

## 2017-06-29 DIAGNOSIS — R627 Adult failure to thrive: Secondary | ICD-10-CM | POA: Diagnosis not present

## 2017-06-30 DIAGNOSIS — Z8673 Personal history of transient ischemic attack (TIA), and cerebral infarction without residual deficits: Secondary | ICD-10-CM | POA: Diagnosis not present

## 2017-06-30 DIAGNOSIS — Z8739 Personal history of other diseases of the musculoskeletal system and connective tissue: Secondary | ICD-10-CM | POA: Diagnosis not present

## 2017-06-30 DIAGNOSIS — I639 Cerebral infarction, unspecified: Secondary | ICD-10-CM | POA: Diagnosis not present

## 2017-06-30 DIAGNOSIS — Z8639 Personal history of other endocrine, nutritional and metabolic disease: Secondary | ICD-10-CM | POA: Diagnosis not present

## 2017-06-30 DIAGNOSIS — Z8679 Personal history of other diseases of the circulatory system: Secondary | ICD-10-CM | POA: Diagnosis not present

## 2017-06-30 DIAGNOSIS — R627 Adult failure to thrive: Secondary | ICD-10-CM | POA: Diagnosis not present

## 2017-07-01 DIAGNOSIS — Z8679 Personal history of other diseases of the circulatory system: Secondary | ICD-10-CM | POA: Diagnosis not present

## 2017-07-01 DIAGNOSIS — Z8739 Personal history of other diseases of the musculoskeletal system and connective tissue: Secondary | ICD-10-CM | POA: Diagnosis not present

## 2017-07-01 DIAGNOSIS — I639 Cerebral infarction, unspecified: Secondary | ICD-10-CM | POA: Diagnosis not present

## 2017-07-01 DIAGNOSIS — Z8639 Personal history of other endocrine, nutritional and metabolic disease: Secondary | ICD-10-CM | POA: Diagnosis not present

## 2017-07-01 DIAGNOSIS — Z8673 Personal history of transient ischemic attack (TIA), and cerebral infarction without residual deficits: Secondary | ICD-10-CM | POA: Diagnosis not present

## 2017-07-01 DIAGNOSIS — R627 Adult failure to thrive: Secondary | ICD-10-CM | POA: Diagnosis not present

## 2017-07-02 DIAGNOSIS — Z8679 Personal history of other diseases of the circulatory system: Secondary | ICD-10-CM | POA: Diagnosis not present

## 2017-07-02 DIAGNOSIS — Z8639 Personal history of other endocrine, nutritional and metabolic disease: Secondary | ICD-10-CM | POA: Diagnosis not present

## 2017-07-02 DIAGNOSIS — Z8673 Personal history of transient ischemic attack (TIA), and cerebral infarction without residual deficits: Secondary | ICD-10-CM | POA: Diagnosis not present

## 2017-07-02 DIAGNOSIS — I639 Cerebral infarction, unspecified: Secondary | ICD-10-CM | POA: Diagnosis not present

## 2017-07-02 DIAGNOSIS — Z8739 Personal history of other diseases of the musculoskeletal system and connective tissue: Secondary | ICD-10-CM | POA: Diagnosis not present

## 2017-07-02 DIAGNOSIS — R627 Adult failure to thrive: Secondary | ICD-10-CM | POA: Diagnosis not present

## 2017-07-05 DIAGNOSIS — I639 Cerebral infarction, unspecified: Secondary | ICD-10-CM | POA: Diagnosis not present

## 2017-07-05 DIAGNOSIS — Z8639 Personal history of other endocrine, nutritional and metabolic disease: Secondary | ICD-10-CM | POA: Diagnosis not present

## 2017-07-05 DIAGNOSIS — Z8679 Personal history of other diseases of the circulatory system: Secondary | ICD-10-CM | POA: Diagnosis not present

## 2017-07-05 DIAGNOSIS — Z8739 Personal history of other diseases of the musculoskeletal system and connective tissue: Secondary | ICD-10-CM | POA: Diagnosis not present

## 2017-07-05 DIAGNOSIS — Z8673 Personal history of transient ischemic attack (TIA), and cerebral infarction without residual deficits: Secondary | ICD-10-CM | POA: Diagnosis not present

## 2017-07-05 DIAGNOSIS — R627 Adult failure to thrive: Secondary | ICD-10-CM | POA: Diagnosis not present

## 2017-07-07 DIAGNOSIS — I639 Cerebral infarction, unspecified: Secondary | ICD-10-CM | POA: Diagnosis not present

## 2017-07-07 DIAGNOSIS — Z8673 Personal history of transient ischemic attack (TIA), and cerebral infarction without residual deficits: Secondary | ICD-10-CM | POA: Diagnosis not present

## 2017-07-07 DIAGNOSIS — Z8639 Personal history of other endocrine, nutritional and metabolic disease: Secondary | ICD-10-CM | POA: Diagnosis not present

## 2017-07-07 DIAGNOSIS — Z8679 Personal history of other diseases of the circulatory system: Secondary | ICD-10-CM | POA: Diagnosis not present

## 2017-07-07 DIAGNOSIS — Z8739 Personal history of other diseases of the musculoskeletal system and connective tissue: Secondary | ICD-10-CM | POA: Diagnosis not present

## 2017-07-07 DIAGNOSIS — R627 Adult failure to thrive: Secondary | ICD-10-CM | POA: Diagnosis not present

## 2017-07-09 DIAGNOSIS — Z8739 Personal history of other diseases of the musculoskeletal system and connective tissue: Secondary | ICD-10-CM | POA: Diagnosis not present

## 2017-07-09 DIAGNOSIS — Z8719 Personal history of other diseases of the digestive system: Secondary | ICD-10-CM | POA: Diagnosis not present

## 2017-07-09 DIAGNOSIS — R627 Adult failure to thrive: Secondary | ICD-10-CM | POA: Diagnosis not present

## 2017-07-09 DIAGNOSIS — Z8679 Personal history of other diseases of the circulatory system: Secondary | ICD-10-CM | POA: Diagnosis not present

## 2017-07-09 DIAGNOSIS — Z8673 Personal history of transient ischemic attack (TIA), and cerebral infarction without residual deficits: Secondary | ICD-10-CM | POA: Diagnosis not present

## 2017-07-09 DIAGNOSIS — Z8639 Personal history of other endocrine, nutritional and metabolic disease: Secondary | ICD-10-CM | POA: Diagnosis not present

## 2017-07-09 DIAGNOSIS — I639 Cerebral infarction, unspecified: Secondary | ICD-10-CM | POA: Diagnosis not present

## 2017-07-12 DIAGNOSIS — Z8639 Personal history of other endocrine, nutritional and metabolic disease: Secondary | ICD-10-CM | POA: Diagnosis not present

## 2017-07-12 DIAGNOSIS — Z8739 Personal history of other diseases of the musculoskeletal system and connective tissue: Secondary | ICD-10-CM | POA: Diagnosis not present

## 2017-07-12 DIAGNOSIS — Z8673 Personal history of transient ischemic attack (TIA), and cerebral infarction without residual deficits: Secondary | ICD-10-CM | POA: Diagnosis not present

## 2017-07-12 DIAGNOSIS — Z8669 Personal history of other diseases of the nervous system and sense organs: Secondary | ICD-10-CM | POA: Diagnosis not present

## 2017-07-12 DIAGNOSIS — R627 Adult failure to thrive: Secondary | ICD-10-CM | POA: Diagnosis not present

## 2017-07-12 DIAGNOSIS — Z8679 Personal history of other diseases of the circulatory system: Secondary | ICD-10-CM | POA: Diagnosis not present

## 2017-07-12 DIAGNOSIS — I639 Cerebral infarction, unspecified: Secondary | ICD-10-CM | POA: Diagnosis not present

## 2017-07-12 DIAGNOSIS — Z8719 Personal history of other diseases of the digestive system: Secondary | ICD-10-CM | POA: Diagnosis not present

## 2017-07-13 DIAGNOSIS — Z8673 Personal history of transient ischemic attack (TIA), and cerebral infarction without residual deficits: Secondary | ICD-10-CM | POA: Diagnosis not present

## 2017-07-13 DIAGNOSIS — R627 Adult failure to thrive: Secondary | ICD-10-CM | POA: Diagnosis not present

## 2017-07-13 DIAGNOSIS — Z8639 Personal history of other endocrine, nutritional and metabolic disease: Secondary | ICD-10-CM | POA: Diagnosis not present

## 2017-07-13 DIAGNOSIS — Z8679 Personal history of other diseases of the circulatory system: Secondary | ICD-10-CM | POA: Diagnosis not present

## 2017-07-13 DIAGNOSIS — I639 Cerebral infarction, unspecified: Secondary | ICD-10-CM | POA: Diagnosis not present

## 2017-07-13 DIAGNOSIS — Z8739 Personal history of other diseases of the musculoskeletal system and connective tissue: Secondary | ICD-10-CM | POA: Diagnosis not present

## 2017-07-14 DIAGNOSIS — Z8739 Personal history of other diseases of the musculoskeletal system and connective tissue: Secondary | ICD-10-CM | POA: Diagnosis not present

## 2017-07-14 DIAGNOSIS — Z8639 Personal history of other endocrine, nutritional and metabolic disease: Secondary | ICD-10-CM | POA: Diagnosis not present

## 2017-07-14 DIAGNOSIS — R627 Adult failure to thrive: Secondary | ICD-10-CM | POA: Diagnosis not present

## 2017-07-14 DIAGNOSIS — Z8679 Personal history of other diseases of the circulatory system: Secondary | ICD-10-CM | POA: Diagnosis not present

## 2017-07-14 DIAGNOSIS — I639 Cerebral infarction, unspecified: Secondary | ICD-10-CM | POA: Diagnosis not present

## 2017-07-14 DIAGNOSIS — Z8673 Personal history of transient ischemic attack (TIA), and cerebral infarction without residual deficits: Secondary | ICD-10-CM | POA: Diagnosis not present

## 2017-07-16 DIAGNOSIS — Z8639 Personal history of other endocrine, nutritional and metabolic disease: Secondary | ICD-10-CM | POA: Diagnosis not present

## 2017-07-16 DIAGNOSIS — Z8673 Personal history of transient ischemic attack (TIA), and cerebral infarction without residual deficits: Secondary | ICD-10-CM | POA: Diagnosis not present

## 2017-07-16 DIAGNOSIS — R627 Adult failure to thrive: Secondary | ICD-10-CM | POA: Diagnosis not present

## 2017-07-16 DIAGNOSIS — Z8739 Personal history of other diseases of the musculoskeletal system and connective tissue: Secondary | ICD-10-CM | POA: Diagnosis not present

## 2017-07-16 DIAGNOSIS — I639 Cerebral infarction, unspecified: Secondary | ICD-10-CM | POA: Diagnosis not present

## 2017-07-16 DIAGNOSIS — Z8679 Personal history of other diseases of the circulatory system: Secondary | ICD-10-CM | POA: Diagnosis not present

## 2017-07-19 DIAGNOSIS — I639 Cerebral infarction, unspecified: Secondary | ICD-10-CM | POA: Diagnosis not present

## 2017-07-19 DIAGNOSIS — Z8639 Personal history of other endocrine, nutritional and metabolic disease: Secondary | ICD-10-CM | POA: Diagnosis not present

## 2017-07-19 DIAGNOSIS — Z8679 Personal history of other diseases of the circulatory system: Secondary | ICD-10-CM | POA: Diagnosis not present

## 2017-07-19 DIAGNOSIS — Z8673 Personal history of transient ischemic attack (TIA), and cerebral infarction without residual deficits: Secondary | ICD-10-CM | POA: Diagnosis not present

## 2017-07-19 DIAGNOSIS — R627 Adult failure to thrive: Secondary | ICD-10-CM | POA: Diagnosis not present

## 2017-07-19 DIAGNOSIS — Z8739 Personal history of other diseases of the musculoskeletal system and connective tissue: Secondary | ICD-10-CM | POA: Diagnosis not present

## 2017-07-21 DIAGNOSIS — R627 Adult failure to thrive: Secondary | ICD-10-CM | POA: Diagnosis not present

## 2017-07-21 DIAGNOSIS — Z8679 Personal history of other diseases of the circulatory system: Secondary | ICD-10-CM | POA: Diagnosis not present

## 2017-07-21 DIAGNOSIS — I639 Cerebral infarction, unspecified: Secondary | ICD-10-CM | POA: Diagnosis not present

## 2017-07-21 DIAGNOSIS — Z8639 Personal history of other endocrine, nutritional and metabolic disease: Secondary | ICD-10-CM | POA: Diagnosis not present

## 2017-07-21 DIAGNOSIS — Z8673 Personal history of transient ischemic attack (TIA), and cerebral infarction without residual deficits: Secondary | ICD-10-CM | POA: Diagnosis not present

## 2017-07-21 DIAGNOSIS — Z8739 Personal history of other diseases of the musculoskeletal system and connective tissue: Secondary | ICD-10-CM | POA: Diagnosis not present

## 2017-07-22 DIAGNOSIS — R627 Adult failure to thrive: Secondary | ICD-10-CM | POA: Diagnosis not present

## 2017-07-22 DIAGNOSIS — Z8739 Personal history of other diseases of the musculoskeletal system and connective tissue: Secondary | ICD-10-CM | POA: Diagnosis not present

## 2017-07-22 DIAGNOSIS — Z8679 Personal history of other diseases of the circulatory system: Secondary | ICD-10-CM | POA: Diagnosis not present

## 2017-07-22 DIAGNOSIS — I639 Cerebral infarction, unspecified: Secondary | ICD-10-CM | POA: Diagnosis not present

## 2017-07-22 DIAGNOSIS — Z8673 Personal history of transient ischemic attack (TIA), and cerebral infarction without residual deficits: Secondary | ICD-10-CM | POA: Diagnosis not present

## 2017-07-22 DIAGNOSIS — Z8639 Personal history of other endocrine, nutritional and metabolic disease: Secondary | ICD-10-CM | POA: Diagnosis not present

## 2017-07-23 DIAGNOSIS — Z8679 Personal history of other diseases of the circulatory system: Secondary | ICD-10-CM | POA: Diagnosis not present

## 2017-07-23 DIAGNOSIS — Z8639 Personal history of other endocrine, nutritional and metabolic disease: Secondary | ICD-10-CM | POA: Diagnosis not present

## 2017-07-23 DIAGNOSIS — I639 Cerebral infarction, unspecified: Secondary | ICD-10-CM | POA: Diagnosis not present

## 2017-07-23 DIAGNOSIS — R627 Adult failure to thrive: Secondary | ICD-10-CM | POA: Diagnosis not present

## 2017-07-23 DIAGNOSIS — Z8673 Personal history of transient ischemic attack (TIA), and cerebral infarction without residual deficits: Secondary | ICD-10-CM | POA: Diagnosis not present

## 2017-07-23 DIAGNOSIS — Z8739 Personal history of other diseases of the musculoskeletal system and connective tissue: Secondary | ICD-10-CM | POA: Diagnosis not present

## 2017-07-26 DIAGNOSIS — I639 Cerebral infarction, unspecified: Secondary | ICD-10-CM | POA: Diagnosis not present

## 2017-07-26 DIAGNOSIS — Z8673 Personal history of transient ischemic attack (TIA), and cerebral infarction without residual deficits: Secondary | ICD-10-CM | POA: Diagnosis not present

## 2017-07-26 DIAGNOSIS — Z8679 Personal history of other diseases of the circulatory system: Secondary | ICD-10-CM | POA: Diagnosis not present

## 2017-07-26 DIAGNOSIS — R627 Adult failure to thrive: Secondary | ICD-10-CM | POA: Diagnosis not present

## 2017-07-26 DIAGNOSIS — Z8739 Personal history of other diseases of the musculoskeletal system and connective tissue: Secondary | ICD-10-CM | POA: Diagnosis not present

## 2017-07-26 DIAGNOSIS — Z8639 Personal history of other endocrine, nutritional and metabolic disease: Secondary | ICD-10-CM | POA: Diagnosis not present

## 2017-07-28 DIAGNOSIS — Z8639 Personal history of other endocrine, nutritional and metabolic disease: Secondary | ICD-10-CM | POA: Diagnosis not present

## 2017-07-28 DIAGNOSIS — I639 Cerebral infarction, unspecified: Secondary | ICD-10-CM | POA: Diagnosis not present

## 2017-07-28 DIAGNOSIS — Z8673 Personal history of transient ischemic attack (TIA), and cerebral infarction without residual deficits: Secondary | ICD-10-CM | POA: Diagnosis not present

## 2017-07-28 DIAGNOSIS — Z8739 Personal history of other diseases of the musculoskeletal system and connective tissue: Secondary | ICD-10-CM | POA: Diagnosis not present

## 2017-07-28 DIAGNOSIS — Z8679 Personal history of other diseases of the circulatory system: Secondary | ICD-10-CM | POA: Diagnosis not present

## 2017-07-28 DIAGNOSIS — R627 Adult failure to thrive: Secondary | ICD-10-CM | POA: Diagnosis not present

## 2017-07-29 DIAGNOSIS — Z8739 Personal history of other diseases of the musculoskeletal system and connective tissue: Secondary | ICD-10-CM | POA: Diagnosis not present

## 2017-07-29 DIAGNOSIS — I639 Cerebral infarction, unspecified: Secondary | ICD-10-CM | POA: Diagnosis not present

## 2017-07-29 DIAGNOSIS — Z8639 Personal history of other endocrine, nutritional and metabolic disease: Secondary | ICD-10-CM | POA: Diagnosis not present

## 2017-07-29 DIAGNOSIS — Z8673 Personal history of transient ischemic attack (TIA), and cerebral infarction without residual deficits: Secondary | ICD-10-CM | POA: Diagnosis not present

## 2017-07-29 DIAGNOSIS — Z8679 Personal history of other diseases of the circulatory system: Secondary | ICD-10-CM | POA: Diagnosis not present

## 2017-07-29 DIAGNOSIS — R627 Adult failure to thrive: Secondary | ICD-10-CM | POA: Diagnosis not present

## 2017-07-30 DIAGNOSIS — I639 Cerebral infarction, unspecified: Secondary | ICD-10-CM | POA: Diagnosis not present

## 2017-07-30 DIAGNOSIS — Z8673 Personal history of transient ischemic attack (TIA), and cerebral infarction without residual deficits: Secondary | ICD-10-CM | POA: Diagnosis not present

## 2017-07-30 DIAGNOSIS — Z8739 Personal history of other diseases of the musculoskeletal system and connective tissue: Secondary | ICD-10-CM | POA: Diagnosis not present

## 2017-07-30 DIAGNOSIS — Z8679 Personal history of other diseases of the circulatory system: Secondary | ICD-10-CM | POA: Diagnosis not present

## 2017-07-30 DIAGNOSIS — Z8639 Personal history of other endocrine, nutritional and metabolic disease: Secondary | ICD-10-CM | POA: Diagnosis not present

## 2017-07-30 DIAGNOSIS — R627 Adult failure to thrive: Secondary | ICD-10-CM | POA: Diagnosis not present

## 2017-08-02 DIAGNOSIS — Z8679 Personal history of other diseases of the circulatory system: Secondary | ICD-10-CM | POA: Diagnosis not present

## 2017-08-02 DIAGNOSIS — R627 Adult failure to thrive: Secondary | ICD-10-CM | POA: Diagnosis not present

## 2017-08-02 DIAGNOSIS — Z8739 Personal history of other diseases of the musculoskeletal system and connective tissue: Secondary | ICD-10-CM | POA: Diagnosis not present

## 2017-08-02 DIAGNOSIS — Z8673 Personal history of transient ischemic attack (TIA), and cerebral infarction without residual deficits: Secondary | ICD-10-CM | POA: Diagnosis not present

## 2017-08-02 DIAGNOSIS — Z8639 Personal history of other endocrine, nutritional and metabolic disease: Secondary | ICD-10-CM | POA: Diagnosis not present

## 2017-08-02 DIAGNOSIS — I639 Cerebral infarction, unspecified: Secondary | ICD-10-CM | POA: Diagnosis not present

## 2017-08-04 DIAGNOSIS — R627 Adult failure to thrive: Secondary | ICD-10-CM | POA: Diagnosis not present

## 2017-08-04 DIAGNOSIS — Z8739 Personal history of other diseases of the musculoskeletal system and connective tissue: Secondary | ICD-10-CM | POA: Diagnosis not present

## 2017-08-04 DIAGNOSIS — Z8679 Personal history of other diseases of the circulatory system: Secondary | ICD-10-CM | POA: Diagnosis not present

## 2017-08-04 DIAGNOSIS — Z8639 Personal history of other endocrine, nutritional and metabolic disease: Secondary | ICD-10-CM | POA: Diagnosis not present

## 2017-08-04 DIAGNOSIS — I639 Cerebral infarction, unspecified: Secondary | ICD-10-CM | POA: Diagnosis not present

## 2017-08-04 DIAGNOSIS — Z8673 Personal history of transient ischemic attack (TIA), and cerebral infarction without residual deficits: Secondary | ICD-10-CM | POA: Diagnosis not present

## 2017-08-06 DIAGNOSIS — Z8679 Personal history of other diseases of the circulatory system: Secondary | ICD-10-CM | POA: Diagnosis not present

## 2017-08-06 DIAGNOSIS — R627 Adult failure to thrive: Secondary | ICD-10-CM | POA: Diagnosis not present

## 2017-08-06 DIAGNOSIS — Z8739 Personal history of other diseases of the musculoskeletal system and connective tissue: Secondary | ICD-10-CM | POA: Diagnosis not present

## 2017-08-06 DIAGNOSIS — Z8673 Personal history of transient ischemic attack (TIA), and cerebral infarction without residual deficits: Secondary | ICD-10-CM | POA: Diagnosis not present

## 2017-08-06 DIAGNOSIS — I639 Cerebral infarction, unspecified: Secondary | ICD-10-CM | POA: Diagnosis not present

## 2017-08-06 DIAGNOSIS — Z8639 Personal history of other endocrine, nutritional and metabolic disease: Secondary | ICD-10-CM | POA: Diagnosis not present

## 2017-08-09 DIAGNOSIS — I639 Cerebral infarction, unspecified: Secondary | ICD-10-CM | POA: Diagnosis not present

## 2017-08-09 DIAGNOSIS — Z8639 Personal history of other endocrine, nutritional and metabolic disease: Secondary | ICD-10-CM | POA: Diagnosis not present

## 2017-08-09 DIAGNOSIS — Z8679 Personal history of other diseases of the circulatory system: Secondary | ICD-10-CM | POA: Diagnosis not present

## 2017-08-09 DIAGNOSIS — Z8739 Personal history of other diseases of the musculoskeletal system and connective tissue: Secondary | ICD-10-CM | POA: Diagnosis not present

## 2017-08-09 DIAGNOSIS — Z8673 Personal history of transient ischemic attack (TIA), and cerebral infarction without residual deficits: Secondary | ICD-10-CM | POA: Diagnosis not present

## 2017-08-09 DIAGNOSIS — R627 Adult failure to thrive: Secondary | ICD-10-CM | POA: Diagnosis not present

## 2017-08-11 DIAGNOSIS — Z8673 Personal history of transient ischemic attack (TIA), and cerebral infarction without residual deficits: Secondary | ICD-10-CM | POA: Diagnosis not present

## 2017-08-11 DIAGNOSIS — R627 Adult failure to thrive: Secondary | ICD-10-CM | POA: Diagnosis not present

## 2017-08-11 DIAGNOSIS — I639 Cerebral infarction, unspecified: Secondary | ICD-10-CM | POA: Diagnosis not present

## 2017-08-11 DIAGNOSIS — Z8639 Personal history of other endocrine, nutritional and metabolic disease: Secondary | ICD-10-CM | POA: Diagnosis not present

## 2017-08-11 DIAGNOSIS — Z8739 Personal history of other diseases of the musculoskeletal system and connective tissue: Secondary | ICD-10-CM | POA: Diagnosis not present

## 2017-08-11 DIAGNOSIS — Z8679 Personal history of other diseases of the circulatory system: Secondary | ICD-10-CM | POA: Diagnosis not present

## 2017-08-12 DIAGNOSIS — Z8719 Personal history of other diseases of the digestive system: Secondary | ICD-10-CM | POA: Diagnosis not present

## 2017-08-12 DIAGNOSIS — Z8639 Personal history of other endocrine, nutritional and metabolic disease: Secondary | ICD-10-CM | POA: Diagnosis not present

## 2017-08-12 DIAGNOSIS — R627 Adult failure to thrive: Secondary | ICD-10-CM | POA: Diagnosis not present

## 2017-08-12 DIAGNOSIS — Z8739 Personal history of other diseases of the musculoskeletal system and connective tissue: Secondary | ICD-10-CM | POA: Diagnosis not present

## 2017-08-12 DIAGNOSIS — Z8673 Personal history of transient ischemic attack (TIA), and cerebral infarction without residual deficits: Secondary | ICD-10-CM | POA: Diagnosis not present

## 2017-08-12 DIAGNOSIS — Z8669 Personal history of other diseases of the nervous system and sense organs: Secondary | ICD-10-CM | POA: Diagnosis not present

## 2017-08-12 DIAGNOSIS — Z8679 Personal history of other diseases of the circulatory system: Secondary | ICD-10-CM | POA: Diagnosis not present

## 2017-08-12 DIAGNOSIS — I639 Cerebral infarction, unspecified: Secondary | ICD-10-CM | POA: Diagnosis not present

## 2017-08-13 DIAGNOSIS — I639 Cerebral infarction, unspecified: Secondary | ICD-10-CM | POA: Diagnosis not present

## 2017-08-13 DIAGNOSIS — R627 Adult failure to thrive: Secondary | ICD-10-CM | POA: Diagnosis not present

## 2017-08-13 DIAGNOSIS — Z8673 Personal history of transient ischemic attack (TIA), and cerebral infarction without residual deficits: Secondary | ICD-10-CM | POA: Diagnosis not present

## 2017-08-13 DIAGNOSIS — Z8739 Personal history of other diseases of the musculoskeletal system and connective tissue: Secondary | ICD-10-CM | POA: Diagnosis not present

## 2017-08-13 DIAGNOSIS — Z8679 Personal history of other diseases of the circulatory system: Secondary | ICD-10-CM | POA: Diagnosis not present

## 2017-08-13 DIAGNOSIS — Z8639 Personal history of other endocrine, nutritional and metabolic disease: Secondary | ICD-10-CM | POA: Diagnosis not present

## 2017-08-16 DIAGNOSIS — I639 Cerebral infarction, unspecified: Secondary | ICD-10-CM | POA: Diagnosis not present

## 2017-08-16 DIAGNOSIS — Z8639 Personal history of other endocrine, nutritional and metabolic disease: Secondary | ICD-10-CM | POA: Diagnosis not present

## 2017-08-16 DIAGNOSIS — R627 Adult failure to thrive: Secondary | ICD-10-CM | POA: Diagnosis not present

## 2017-08-16 DIAGNOSIS — Z8679 Personal history of other diseases of the circulatory system: Secondary | ICD-10-CM | POA: Diagnosis not present

## 2017-08-16 DIAGNOSIS — Z8673 Personal history of transient ischemic attack (TIA), and cerebral infarction without residual deficits: Secondary | ICD-10-CM | POA: Diagnosis not present

## 2017-08-16 DIAGNOSIS — Z8739 Personal history of other diseases of the musculoskeletal system and connective tissue: Secondary | ICD-10-CM | POA: Diagnosis not present

## 2017-08-18 DIAGNOSIS — R627 Adult failure to thrive: Secondary | ICD-10-CM | POA: Diagnosis not present

## 2017-08-18 DIAGNOSIS — Z8679 Personal history of other diseases of the circulatory system: Secondary | ICD-10-CM | POA: Diagnosis not present

## 2017-08-18 DIAGNOSIS — Z8673 Personal history of transient ischemic attack (TIA), and cerebral infarction without residual deficits: Secondary | ICD-10-CM | POA: Diagnosis not present

## 2017-08-18 DIAGNOSIS — Z8639 Personal history of other endocrine, nutritional and metabolic disease: Secondary | ICD-10-CM | POA: Diagnosis not present

## 2017-08-18 DIAGNOSIS — I639 Cerebral infarction, unspecified: Secondary | ICD-10-CM | POA: Diagnosis not present

## 2017-08-18 DIAGNOSIS — Z8739 Personal history of other diseases of the musculoskeletal system and connective tissue: Secondary | ICD-10-CM | POA: Diagnosis not present

## 2017-08-20 DIAGNOSIS — Z8673 Personal history of transient ischemic attack (TIA), and cerebral infarction without residual deficits: Secondary | ICD-10-CM | POA: Diagnosis not present

## 2017-08-20 DIAGNOSIS — Z8639 Personal history of other endocrine, nutritional and metabolic disease: Secondary | ICD-10-CM | POA: Diagnosis not present

## 2017-08-20 DIAGNOSIS — I639 Cerebral infarction, unspecified: Secondary | ICD-10-CM | POA: Diagnosis not present

## 2017-08-20 DIAGNOSIS — R627 Adult failure to thrive: Secondary | ICD-10-CM | POA: Diagnosis not present

## 2017-08-20 DIAGNOSIS — Z8679 Personal history of other diseases of the circulatory system: Secondary | ICD-10-CM | POA: Diagnosis not present

## 2017-08-20 DIAGNOSIS — Z8739 Personal history of other diseases of the musculoskeletal system and connective tissue: Secondary | ICD-10-CM | POA: Diagnosis not present

## 2017-08-23 DIAGNOSIS — R627 Adult failure to thrive: Secondary | ICD-10-CM | POA: Diagnosis not present

## 2017-08-23 DIAGNOSIS — Z8739 Personal history of other diseases of the musculoskeletal system and connective tissue: Secondary | ICD-10-CM | POA: Diagnosis not present

## 2017-08-23 DIAGNOSIS — Z8673 Personal history of transient ischemic attack (TIA), and cerebral infarction without residual deficits: Secondary | ICD-10-CM | POA: Diagnosis not present

## 2017-08-23 DIAGNOSIS — I639 Cerebral infarction, unspecified: Secondary | ICD-10-CM | POA: Diagnosis not present

## 2017-08-23 DIAGNOSIS — Z8639 Personal history of other endocrine, nutritional and metabolic disease: Secondary | ICD-10-CM | POA: Diagnosis not present

## 2017-08-23 DIAGNOSIS — Z8679 Personal history of other diseases of the circulatory system: Secondary | ICD-10-CM | POA: Diagnosis not present

## 2017-08-25 DIAGNOSIS — I639 Cerebral infarction, unspecified: Secondary | ICD-10-CM | POA: Diagnosis not present

## 2017-08-25 DIAGNOSIS — Z8679 Personal history of other diseases of the circulatory system: Secondary | ICD-10-CM | POA: Diagnosis not present

## 2017-08-25 DIAGNOSIS — Z8673 Personal history of transient ischemic attack (TIA), and cerebral infarction without residual deficits: Secondary | ICD-10-CM | POA: Diagnosis not present

## 2017-08-25 DIAGNOSIS — R627 Adult failure to thrive: Secondary | ICD-10-CM | POA: Diagnosis not present

## 2017-08-25 DIAGNOSIS — Z8639 Personal history of other endocrine, nutritional and metabolic disease: Secondary | ICD-10-CM | POA: Diagnosis not present

## 2017-08-25 DIAGNOSIS — Z8739 Personal history of other diseases of the musculoskeletal system and connective tissue: Secondary | ICD-10-CM | POA: Diagnosis not present

## 2017-08-26 DIAGNOSIS — R627 Adult failure to thrive: Secondary | ICD-10-CM | POA: Diagnosis not present

## 2017-08-26 DIAGNOSIS — Z8673 Personal history of transient ischemic attack (TIA), and cerebral infarction without residual deficits: Secondary | ICD-10-CM | POA: Diagnosis not present

## 2017-08-26 DIAGNOSIS — Z8739 Personal history of other diseases of the musculoskeletal system and connective tissue: Secondary | ICD-10-CM | POA: Diagnosis not present

## 2017-08-26 DIAGNOSIS — Z8679 Personal history of other diseases of the circulatory system: Secondary | ICD-10-CM | POA: Diagnosis not present

## 2017-08-26 DIAGNOSIS — I639 Cerebral infarction, unspecified: Secondary | ICD-10-CM | POA: Diagnosis not present

## 2017-08-26 DIAGNOSIS — Z8639 Personal history of other endocrine, nutritional and metabolic disease: Secondary | ICD-10-CM | POA: Diagnosis not present

## 2017-08-27 DIAGNOSIS — R627 Adult failure to thrive: Secondary | ICD-10-CM | POA: Diagnosis not present

## 2017-08-27 DIAGNOSIS — Z8639 Personal history of other endocrine, nutritional and metabolic disease: Secondary | ICD-10-CM | POA: Diagnosis not present

## 2017-08-27 DIAGNOSIS — Z8679 Personal history of other diseases of the circulatory system: Secondary | ICD-10-CM | POA: Diagnosis not present

## 2017-08-27 DIAGNOSIS — Z8673 Personal history of transient ischemic attack (TIA), and cerebral infarction without residual deficits: Secondary | ICD-10-CM | POA: Diagnosis not present

## 2017-08-27 DIAGNOSIS — I639 Cerebral infarction, unspecified: Secondary | ICD-10-CM | POA: Diagnosis not present

## 2017-08-27 DIAGNOSIS — Z8739 Personal history of other diseases of the musculoskeletal system and connective tissue: Secondary | ICD-10-CM | POA: Diagnosis not present

## 2017-08-30 DIAGNOSIS — Z8679 Personal history of other diseases of the circulatory system: Secondary | ICD-10-CM | POA: Diagnosis not present

## 2017-08-30 DIAGNOSIS — R627 Adult failure to thrive: Secondary | ICD-10-CM | POA: Diagnosis not present

## 2017-08-30 DIAGNOSIS — Z8639 Personal history of other endocrine, nutritional and metabolic disease: Secondary | ICD-10-CM | POA: Diagnosis not present

## 2017-08-30 DIAGNOSIS — Z8673 Personal history of transient ischemic attack (TIA), and cerebral infarction without residual deficits: Secondary | ICD-10-CM | POA: Diagnosis not present

## 2017-08-30 DIAGNOSIS — I639 Cerebral infarction, unspecified: Secondary | ICD-10-CM | POA: Diagnosis not present

## 2017-08-30 DIAGNOSIS — Z8739 Personal history of other diseases of the musculoskeletal system and connective tissue: Secondary | ICD-10-CM | POA: Diagnosis not present

## 2017-09-01 DIAGNOSIS — Z8739 Personal history of other diseases of the musculoskeletal system and connective tissue: Secondary | ICD-10-CM | POA: Diagnosis not present

## 2017-09-01 DIAGNOSIS — Z8679 Personal history of other diseases of the circulatory system: Secondary | ICD-10-CM | POA: Diagnosis not present

## 2017-09-01 DIAGNOSIS — Z8673 Personal history of transient ischemic attack (TIA), and cerebral infarction without residual deficits: Secondary | ICD-10-CM | POA: Diagnosis not present

## 2017-09-01 DIAGNOSIS — I639 Cerebral infarction, unspecified: Secondary | ICD-10-CM | POA: Diagnosis not present

## 2017-09-01 DIAGNOSIS — Z8639 Personal history of other endocrine, nutritional and metabolic disease: Secondary | ICD-10-CM | POA: Diagnosis not present

## 2017-09-01 DIAGNOSIS — R627 Adult failure to thrive: Secondary | ICD-10-CM | POA: Diagnosis not present

## 2017-09-03 DIAGNOSIS — Z8639 Personal history of other endocrine, nutritional and metabolic disease: Secondary | ICD-10-CM | POA: Diagnosis not present

## 2017-09-03 DIAGNOSIS — I639 Cerebral infarction, unspecified: Secondary | ICD-10-CM | POA: Diagnosis not present

## 2017-09-03 DIAGNOSIS — Z8679 Personal history of other diseases of the circulatory system: Secondary | ICD-10-CM | POA: Diagnosis not present

## 2017-09-03 DIAGNOSIS — Z8739 Personal history of other diseases of the musculoskeletal system and connective tissue: Secondary | ICD-10-CM | POA: Diagnosis not present

## 2017-09-03 DIAGNOSIS — R627 Adult failure to thrive: Secondary | ICD-10-CM | POA: Diagnosis not present

## 2017-09-03 DIAGNOSIS — Z8673 Personal history of transient ischemic attack (TIA), and cerebral infarction without residual deficits: Secondary | ICD-10-CM | POA: Diagnosis not present

## 2017-09-06 DIAGNOSIS — R627 Adult failure to thrive: Secondary | ICD-10-CM | POA: Diagnosis not present

## 2017-09-06 DIAGNOSIS — I639 Cerebral infarction, unspecified: Secondary | ICD-10-CM | POA: Diagnosis not present

## 2017-09-06 DIAGNOSIS — Z8679 Personal history of other diseases of the circulatory system: Secondary | ICD-10-CM | POA: Diagnosis not present

## 2017-09-06 DIAGNOSIS — Z8739 Personal history of other diseases of the musculoskeletal system and connective tissue: Secondary | ICD-10-CM | POA: Diagnosis not present

## 2017-09-06 DIAGNOSIS — Z8639 Personal history of other endocrine, nutritional and metabolic disease: Secondary | ICD-10-CM | POA: Diagnosis not present

## 2017-09-06 DIAGNOSIS — Z8673 Personal history of transient ischemic attack (TIA), and cerebral infarction without residual deficits: Secondary | ICD-10-CM | POA: Diagnosis not present

## 2017-09-07 DIAGNOSIS — Z8739 Personal history of other diseases of the musculoskeletal system and connective tissue: Secondary | ICD-10-CM | POA: Diagnosis not present

## 2017-09-07 DIAGNOSIS — I639 Cerebral infarction, unspecified: Secondary | ICD-10-CM | POA: Diagnosis not present

## 2017-09-07 DIAGNOSIS — Z8679 Personal history of other diseases of the circulatory system: Secondary | ICD-10-CM | POA: Diagnosis not present

## 2017-09-07 DIAGNOSIS — Z8673 Personal history of transient ischemic attack (TIA), and cerebral infarction without residual deficits: Secondary | ICD-10-CM | POA: Diagnosis not present

## 2017-09-07 DIAGNOSIS — R627 Adult failure to thrive: Secondary | ICD-10-CM | POA: Diagnosis not present

## 2017-09-07 DIAGNOSIS — Z8639 Personal history of other endocrine, nutritional and metabolic disease: Secondary | ICD-10-CM | POA: Diagnosis not present

## 2017-09-08 DIAGNOSIS — Z8639 Personal history of other endocrine, nutritional and metabolic disease: Secondary | ICD-10-CM | POA: Diagnosis not present

## 2017-09-08 DIAGNOSIS — R627 Adult failure to thrive: Secondary | ICD-10-CM | POA: Diagnosis not present

## 2017-09-08 DIAGNOSIS — I639 Cerebral infarction, unspecified: Secondary | ICD-10-CM | POA: Diagnosis not present

## 2017-09-08 DIAGNOSIS — Z8679 Personal history of other diseases of the circulatory system: Secondary | ICD-10-CM | POA: Diagnosis not present

## 2017-09-08 DIAGNOSIS — Z8673 Personal history of transient ischemic attack (TIA), and cerebral infarction without residual deficits: Secondary | ICD-10-CM | POA: Diagnosis not present

## 2017-09-08 DIAGNOSIS — Z8739 Personal history of other diseases of the musculoskeletal system and connective tissue: Secondary | ICD-10-CM | POA: Diagnosis not present

## 2017-09-10 DIAGNOSIS — Z8679 Personal history of other diseases of the circulatory system: Secondary | ICD-10-CM | POA: Diagnosis not present

## 2017-09-10 DIAGNOSIS — Z8639 Personal history of other endocrine, nutritional and metabolic disease: Secondary | ICD-10-CM | POA: Diagnosis not present

## 2017-09-10 DIAGNOSIS — R627 Adult failure to thrive: Secondary | ICD-10-CM | POA: Diagnosis not present

## 2017-09-10 DIAGNOSIS — I639 Cerebral infarction, unspecified: Secondary | ICD-10-CM | POA: Diagnosis not present

## 2017-09-10 DIAGNOSIS — Z8673 Personal history of transient ischemic attack (TIA), and cerebral infarction without residual deficits: Secondary | ICD-10-CM | POA: Diagnosis not present

## 2017-09-10 DIAGNOSIS — Z8739 Personal history of other diseases of the musculoskeletal system and connective tissue: Secondary | ICD-10-CM | POA: Diagnosis not present

## 2017-09-11 DIAGNOSIS — Z8669 Personal history of other diseases of the nervous system and sense organs: Secondary | ICD-10-CM | POA: Diagnosis not present

## 2017-09-11 DIAGNOSIS — Z8639 Personal history of other endocrine, nutritional and metabolic disease: Secondary | ICD-10-CM | POA: Diagnosis not present

## 2017-09-11 DIAGNOSIS — R627 Adult failure to thrive: Secondary | ICD-10-CM | POA: Diagnosis not present

## 2017-09-11 DIAGNOSIS — Z8719 Personal history of other diseases of the digestive system: Secondary | ICD-10-CM | POA: Diagnosis not present

## 2017-09-11 DIAGNOSIS — Z8673 Personal history of transient ischemic attack (TIA), and cerebral infarction without residual deficits: Secondary | ICD-10-CM | POA: Diagnosis not present

## 2017-09-11 DIAGNOSIS — I639 Cerebral infarction, unspecified: Secondary | ICD-10-CM | POA: Diagnosis not present

## 2017-09-11 DIAGNOSIS — Z8679 Personal history of other diseases of the circulatory system: Secondary | ICD-10-CM | POA: Diagnosis not present

## 2017-09-11 DIAGNOSIS — Z8739 Personal history of other diseases of the musculoskeletal system and connective tissue: Secondary | ICD-10-CM | POA: Diagnosis not present

## 2017-09-12 DIAGNOSIS — Z8679 Personal history of other diseases of the circulatory system: Secondary | ICD-10-CM | POA: Diagnosis not present

## 2017-09-12 DIAGNOSIS — I639 Cerebral infarction, unspecified: Secondary | ICD-10-CM | POA: Diagnosis not present

## 2017-09-12 DIAGNOSIS — R627 Adult failure to thrive: Secondary | ICD-10-CM | POA: Diagnosis not present

## 2017-09-12 DIAGNOSIS — Z8639 Personal history of other endocrine, nutritional and metabolic disease: Secondary | ICD-10-CM | POA: Diagnosis not present

## 2017-09-12 DIAGNOSIS — Z8673 Personal history of transient ischemic attack (TIA), and cerebral infarction without residual deficits: Secondary | ICD-10-CM | POA: Diagnosis not present

## 2017-09-12 DIAGNOSIS — Z8739 Personal history of other diseases of the musculoskeletal system and connective tissue: Secondary | ICD-10-CM | POA: Diagnosis not present

## 2017-09-13 DIAGNOSIS — Z8739 Personal history of other diseases of the musculoskeletal system and connective tissue: Secondary | ICD-10-CM | POA: Diagnosis not present

## 2017-09-13 DIAGNOSIS — I639 Cerebral infarction, unspecified: Secondary | ICD-10-CM | POA: Diagnosis not present

## 2017-09-13 DIAGNOSIS — Z8673 Personal history of transient ischemic attack (TIA), and cerebral infarction without residual deficits: Secondary | ICD-10-CM | POA: Diagnosis not present

## 2017-09-13 DIAGNOSIS — Z8639 Personal history of other endocrine, nutritional and metabolic disease: Secondary | ICD-10-CM | POA: Diagnosis not present

## 2017-09-13 DIAGNOSIS — R627 Adult failure to thrive: Secondary | ICD-10-CM | POA: Diagnosis not present

## 2017-09-13 DIAGNOSIS — Z8679 Personal history of other diseases of the circulatory system: Secondary | ICD-10-CM | POA: Diagnosis not present

## 2017-09-15 DIAGNOSIS — Z8739 Personal history of other diseases of the musculoskeletal system and connective tissue: Secondary | ICD-10-CM | POA: Diagnosis not present

## 2017-09-15 DIAGNOSIS — Z8639 Personal history of other endocrine, nutritional and metabolic disease: Secondary | ICD-10-CM | POA: Diagnosis not present

## 2017-09-15 DIAGNOSIS — Z8679 Personal history of other diseases of the circulatory system: Secondary | ICD-10-CM | POA: Diagnosis not present

## 2017-09-15 DIAGNOSIS — I639 Cerebral infarction, unspecified: Secondary | ICD-10-CM | POA: Diagnosis not present

## 2017-09-15 DIAGNOSIS — Z8673 Personal history of transient ischemic attack (TIA), and cerebral infarction without residual deficits: Secondary | ICD-10-CM | POA: Diagnosis not present

## 2017-09-15 DIAGNOSIS — R627 Adult failure to thrive: Secondary | ICD-10-CM | POA: Diagnosis not present

## 2017-09-17 DIAGNOSIS — R627 Adult failure to thrive: Secondary | ICD-10-CM | POA: Diagnosis not present

## 2017-09-17 DIAGNOSIS — Z8639 Personal history of other endocrine, nutritional and metabolic disease: Secondary | ICD-10-CM | POA: Diagnosis not present

## 2017-09-17 DIAGNOSIS — I639 Cerebral infarction, unspecified: Secondary | ICD-10-CM | POA: Diagnosis not present

## 2017-09-17 DIAGNOSIS — Z8673 Personal history of transient ischemic attack (TIA), and cerebral infarction without residual deficits: Secondary | ICD-10-CM | POA: Diagnosis not present

## 2017-09-17 DIAGNOSIS — Z8679 Personal history of other diseases of the circulatory system: Secondary | ICD-10-CM | POA: Diagnosis not present

## 2017-09-17 DIAGNOSIS — Z8739 Personal history of other diseases of the musculoskeletal system and connective tissue: Secondary | ICD-10-CM | POA: Diagnosis not present

## 2017-09-20 DIAGNOSIS — Z8739 Personal history of other diseases of the musculoskeletal system and connective tissue: Secondary | ICD-10-CM | POA: Diagnosis not present

## 2017-09-20 DIAGNOSIS — Z8679 Personal history of other diseases of the circulatory system: Secondary | ICD-10-CM | POA: Diagnosis not present

## 2017-09-20 DIAGNOSIS — I639 Cerebral infarction, unspecified: Secondary | ICD-10-CM | POA: Diagnosis not present

## 2017-09-20 DIAGNOSIS — Z8673 Personal history of transient ischemic attack (TIA), and cerebral infarction without residual deficits: Secondary | ICD-10-CM | POA: Diagnosis not present

## 2017-09-20 DIAGNOSIS — R627 Adult failure to thrive: Secondary | ICD-10-CM | POA: Diagnosis not present

## 2017-09-20 DIAGNOSIS — Z8639 Personal history of other endocrine, nutritional and metabolic disease: Secondary | ICD-10-CM | POA: Diagnosis not present

## 2017-09-21 DIAGNOSIS — Z8739 Personal history of other diseases of the musculoskeletal system and connective tissue: Secondary | ICD-10-CM | POA: Diagnosis not present

## 2017-09-21 DIAGNOSIS — Z8673 Personal history of transient ischemic attack (TIA), and cerebral infarction without residual deficits: Secondary | ICD-10-CM | POA: Diagnosis not present

## 2017-09-21 DIAGNOSIS — Z8639 Personal history of other endocrine, nutritional and metabolic disease: Secondary | ICD-10-CM | POA: Diagnosis not present

## 2017-09-21 DIAGNOSIS — R627 Adult failure to thrive: Secondary | ICD-10-CM | POA: Diagnosis not present

## 2017-09-21 DIAGNOSIS — I639 Cerebral infarction, unspecified: Secondary | ICD-10-CM | POA: Diagnosis not present

## 2017-09-21 DIAGNOSIS — Z8679 Personal history of other diseases of the circulatory system: Secondary | ICD-10-CM | POA: Diagnosis not present

## 2017-09-22 DIAGNOSIS — Z8739 Personal history of other diseases of the musculoskeletal system and connective tissue: Secondary | ICD-10-CM | POA: Diagnosis not present

## 2017-09-22 DIAGNOSIS — Z8673 Personal history of transient ischemic attack (TIA), and cerebral infarction without residual deficits: Secondary | ICD-10-CM | POA: Diagnosis not present

## 2017-09-22 DIAGNOSIS — I639 Cerebral infarction, unspecified: Secondary | ICD-10-CM | POA: Diagnosis not present

## 2017-09-22 DIAGNOSIS — R627 Adult failure to thrive: Secondary | ICD-10-CM | POA: Diagnosis not present

## 2017-09-22 DIAGNOSIS — Z8639 Personal history of other endocrine, nutritional and metabolic disease: Secondary | ICD-10-CM | POA: Diagnosis not present

## 2017-09-22 DIAGNOSIS — Z8679 Personal history of other diseases of the circulatory system: Secondary | ICD-10-CM | POA: Diagnosis not present

## 2017-09-24 DIAGNOSIS — I639 Cerebral infarction, unspecified: Secondary | ICD-10-CM | POA: Diagnosis not present

## 2017-09-24 DIAGNOSIS — Z8639 Personal history of other endocrine, nutritional and metabolic disease: Secondary | ICD-10-CM | POA: Diagnosis not present

## 2017-09-24 DIAGNOSIS — Z8739 Personal history of other diseases of the musculoskeletal system and connective tissue: Secondary | ICD-10-CM | POA: Diagnosis not present

## 2017-09-24 DIAGNOSIS — R627 Adult failure to thrive: Secondary | ICD-10-CM | POA: Diagnosis not present

## 2017-09-24 DIAGNOSIS — Z8673 Personal history of transient ischemic attack (TIA), and cerebral infarction without residual deficits: Secondary | ICD-10-CM | POA: Diagnosis not present

## 2017-09-24 DIAGNOSIS — Z8679 Personal history of other diseases of the circulatory system: Secondary | ICD-10-CM | POA: Diagnosis not present

## 2017-09-27 DIAGNOSIS — Z8673 Personal history of transient ischemic attack (TIA), and cerebral infarction without residual deficits: Secondary | ICD-10-CM | POA: Diagnosis not present

## 2017-09-27 DIAGNOSIS — R627 Adult failure to thrive: Secondary | ICD-10-CM | POA: Diagnosis not present

## 2017-09-27 DIAGNOSIS — Z8679 Personal history of other diseases of the circulatory system: Secondary | ICD-10-CM | POA: Diagnosis not present

## 2017-09-27 DIAGNOSIS — I639 Cerebral infarction, unspecified: Secondary | ICD-10-CM | POA: Diagnosis not present

## 2017-09-27 DIAGNOSIS — Z8739 Personal history of other diseases of the musculoskeletal system and connective tissue: Secondary | ICD-10-CM | POA: Diagnosis not present

## 2017-09-27 DIAGNOSIS — Z8639 Personal history of other endocrine, nutritional and metabolic disease: Secondary | ICD-10-CM | POA: Diagnosis not present

## 2017-09-29 DIAGNOSIS — R627 Adult failure to thrive: Secondary | ICD-10-CM | POA: Diagnosis not present

## 2017-09-29 DIAGNOSIS — I639 Cerebral infarction, unspecified: Secondary | ICD-10-CM | POA: Diagnosis not present

## 2017-09-29 DIAGNOSIS — Z8639 Personal history of other endocrine, nutritional and metabolic disease: Secondary | ICD-10-CM | POA: Diagnosis not present

## 2017-09-29 DIAGNOSIS — Z8739 Personal history of other diseases of the musculoskeletal system and connective tissue: Secondary | ICD-10-CM | POA: Diagnosis not present

## 2017-09-29 DIAGNOSIS — Z8679 Personal history of other diseases of the circulatory system: Secondary | ICD-10-CM | POA: Diagnosis not present

## 2017-09-29 DIAGNOSIS — Z8673 Personal history of transient ischemic attack (TIA), and cerebral infarction without residual deficits: Secondary | ICD-10-CM | POA: Diagnosis not present

## 2017-09-30 DIAGNOSIS — Z8739 Personal history of other diseases of the musculoskeletal system and connective tissue: Secondary | ICD-10-CM | POA: Diagnosis not present

## 2017-09-30 DIAGNOSIS — R627 Adult failure to thrive: Secondary | ICD-10-CM | POA: Diagnosis not present

## 2017-09-30 DIAGNOSIS — I639 Cerebral infarction, unspecified: Secondary | ICD-10-CM | POA: Diagnosis not present

## 2017-09-30 DIAGNOSIS — Z8639 Personal history of other endocrine, nutritional and metabolic disease: Secondary | ICD-10-CM | POA: Diagnosis not present

## 2017-09-30 DIAGNOSIS — Z8679 Personal history of other diseases of the circulatory system: Secondary | ICD-10-CM | POA: Diagnosis not present

## 2017-09-30 DIAGNOSIS — Z8673 Personal history of transient ischemic attack (TIA), and cerebral infarction without residual deficits: Secondary | ICD-10-CM | POA: Diagnosis not present

## 2017-10-01 DIAGNOSIS — R627 Adult failure to thrive: Secondary | ICD-10-CM | POA: Diagnosis not present

## 2017-10-01 DIAGNOSIS — Z8673 Personal history of transient ischemic attack (TIA), and cerebral infarction without residual deficits: Secondary | ICD-10-CM | POA: Diagnosis not present

## 2017-10-01 DIAGNOSIS — I639 Cerebral infarction, unspecified: Secondary | ICD-10-CM | POA: Diagnosis not present

## 2017-10-01 DIAGNOSIS — Z8679 Personal history of other diseases of the circulatory system: Secondary | ICD-10-CM | POA: Diagnosis not present

## 2017-10-01 DIAGNOSIS — Z8739 Personal history of other diseases of the musculoskeletal system and connective tissue: Secondary | ICD-10-CM | POA: Diagnosis not present

## 2017-10-01 DIAGNOSIS — Z8639 Personal history of other endocrine, nutritional and metabolic disease: Secondary | ICD-10-CM | POA: Diagnosis not present

## 2017-10-04 DIAGNOSIS — Z8679 Personal history of other diseases of the circulatory system: Secondary | ICD-10-CM | POA: Diagnosis not present

## 2017-10-04 DIAGNOSIS — I639 Cerebral infarction, unspecified: Secondary | ICD-10-CM | POA: Diagnosis not present

## 2017-10-04 DIAGNOSIS — R627 Adult failure to thrive: Secondary | ICD-10-CM | POA: Diagnosis not present

## 2017-10-04 DIAGNOSIS — Z8739 Personal history of other diseases of the musculoskeletal system and connective tissue: Secondary | ICD-10-CM | POA: Diagnosis not present

## 2017-10-04 DIAGNOSIS — Z8673 Personal history of transient ischemic attack (TIA), and cerebral infarction without residual deficits: Secondary | ICD-10-CM | POA: Diagnosis not present

## 2017-10-04 DIAGNOSIS — Z8639 Personal history of other endocrine, nutritional and metabolic disease: Secondary | ICD-10-CM | POA: Diagnosis not present

## 2017-10-06 DIAGNOSIS — Z8639 Personal history of other endocrine, nutritional and metabolic disease: Secondary | ICD-10-CM | POA: Diagnosis not present

## 2017-10-06 DIAGNOSIS — Z8739 Personal history of other diseases of the musculoskeletal system and connective tissue: Secondary | ICD-10-CM | POA: Diagnosis not present

## 2017-10-06 DIAGNOSIS — R627 Adult failure to thrive: Secondary | ICD-10-CM | POA: Diagnosis not present

## 2017-10-06 DIAGNOSIS — I639 Cerebral infarction, unspecified: Secondary | ICD-10-CM | POA: Diagnosis not present

## 2017-10-06 DIAGNOSIS — Z8673 Personal history of transient ischemic attack (TIA), and cerebral infarction without residual deficits: Secondary | ICD-10-CM | POA: Diagnosis not present

## 2017-10-06 DIAGNOSIS — Z8679 Personal history of other diseases of the circulatory system: Secondary | ICD-10-CM | POA: Diagnosis not present

## 2017-10-07 DIAGNOSIS — I639 Cerebral infarction, unspecified: Secondary | ICD-10-CM | POA: Diagnosis not present

## 2017-10-07 DIAGNOSIS — Z8673 Personal history of transient ischemic attack (TIA), and cerebral infarction without residual deficits: Secondary | ICD-10-CM | POA: Diagnosis not present

## 2017-10-07 DIAGNOSIS — Z8739 Personal history of other diseases of the musculoskeletal system and connective tissue: Secondary | ICD-10-CM | POA: Diagnosis not present

## 2017-10-07 DIAGNOSIS — Z8639 Personal history of other endocrine, nutritional and metabolic disease: Secondary | ICD-10-CM | POA: Diagnosis not present

## 2017-10-07 DIAGNOSIS — Z8679 Personal history of other diseases of the circulatory system: Secondary | ICD-10-CM | POA: Diagnosis not present

## 2017-10-07 DIAGNOSIS — R627 Adult failure to thrive: Secondary | ICD-10-CM | POA: Diagnosis not present

## 2017-10-08 DIAGNOSIS — Z8739 Personal history of other diseases of the musculoskeletal system and connective tissue: Secondary | ICD-10-CM | POA: Diagnosis not present

## 2017-10-08 DIAGNOSIS — I639 Cerebral infarction, unspecified: Secondary | ICD-10-CM | POA: Diagnosis not present

## 2017-10-08 DIAGNOSIS — R627 Adult failure to thrive: Secondary | ICD-10-CM | POA: Diagnosis not present

## 2017-10-08 DIAGNOSIS — Z8673 Personal history of transient ischemic attack (TIA), and cerebral infarction without residual deficits: Secondary | ICD-10-CM | POA: Diagnosis not present

## 2017-10-08 DIAGNOSIS — Z8639 Personal history of other endocrine, nutritional and metabolic disease: Secondary | ICD-10-CM | POA: Diagnosis not present

## 2017-10-08 DIAGNOSIS — Z8679 Personal history of other diseases of the circulatory system: Secondary | ICD-10-CM | POA: Diagnosis not present

## 2017-10-11 DIAGNOSIS — I639 Cerebral infarction, unspecified: Secondary | ICD-10-CM | POA: Diagnosis not present

## 2017-10-11 DIAGNOSIS — Z8639 Personal history of other endocrine, nutritional and metabolic disease: Secondary | ICD-10-CM | POA: Diagnosis not present

## 2017-10-11 DIAGNOSIS — R627 Adult failure to thrive: Secondary | ICD-10-CM | POA: Diagnosis not present

## 2017-10-11 DIAGNOSIS — Z8739 Personal history of other diseases of the musculoskeletal system and connective tissue: Secondary | ICD-10-CM | POA: Diagnosis not present

## 2017-10-11 DIAGNOSIS — Z8673 Personal history of transient ischemic attack (TIA), and cerebral infarction without residual deficits: Secondary | ICD-10-CM | POA: Diagnosis not present

## 2017-10-11 DIAGNOSIS — Z8679 Personal history of other diseases of the circulatory system: Secondary | ICD-10-CM | POA: Diagnosis not present

## 2017-10-12 DIAGNOSIS — Z8673 Personal history of transient ischemic attack (TIA), and cerebral infarction without residual deficits: Secondary | ICD-10-CM | POA: Diagnosis not present

## 2017-10-12 DIAGNOSIS — Z8679 Personal history of other diseases of the circulatory system: Secondary | ICD-10-CM | POA: Diagnosis not present

## 2017-10-12 DIAGNOSIS — Z8719 Personal history of other diseases of the digestive system: Secondary | ICD-10-CM | POA: Diagnosis not present

## 2017-10-12 DIAGNOSIS — Z8669 Personal history of other diseases of the nervous system and sense organs: Secondary | ICD-10-CM | POA: Diagnosis not present

## 2017-10-12 DIAGNOSIS — R627 Adult failure to thrive: Secondary | ICD-10-CM | POA: Diagnosis not present

## 2017-10-12 DIAGNOSIS — Z8639 Personal history of other endocrine, nutritional and metabolic disease: Secondary | ICD-10-CM | POA: Diagnosis not present

## 2017-10-12 DIAGNOSIS — Z8739 Personal history of other diseases of the musculoskeletal system and connective tissue: Secondary | ICD-10-CM | POA: Diagnosis not present

## 2017-10-12 DIAGNOSIS — I639 Cerebral infarction, unspecified: Secondary | ICD-10-CM | POA: Diagnosis not present

## 2017-10-13 DIAGNOSIS — Z8679 Personal history of other diseases of the circulatory system: Secondary | ICD-10-CM | POA: Diagnosis not present

## 2017-10-13 DIAGNOSIS — Z8639 Personal history of other endocrine, nutritional and metabolic disease: Secondary | ICD-10-CM | POA: Diagnosis not present

## 2017-10-13 DIAGNOSIS — I639 Cerebral infarction, unspecified: Secondary | ICD-10-CM | POA: Diagnosis not present

## 2017-10-13 DIAGNOSIS — Z8673 Personal history of transient ischemic attack (TIA), and cerebral infarction without residual deficits: Secondary | ICD-10-CM | POA: Diagnosis not present

## 2017-10-13 DIAGNOSIS — Z8739 Personal history of other diseases of the musculoskeletal system and connective tissue: Secondary | ICD-10-CM | POA: Diagnosis not present

## 2017-10-13 DIAGNOSIS — R627 Adult failure to thrive: Secondary | ICD-10-CM | POA: Diagnosis not present

## 2017-10-14 DIAGNOSIS — I639 Cerebral infarction, unspecified: Secondary | ICD-10-CM | POA: Diagnosis not present

## 2017-10-14 DIAGNOSIS — Z8679 Personal history of other diseases of the circulatory system: Secondary | ICD-10-CM | POA: Diagnosis not present

## 2017-10-14 DIAGNOSIS — Z8673 Personal history of transient ischemic attack (TIA), and cerebral infarction without residual deficits: Secondary | ICD-10-CM | POA: Diagnosis not present

## 2017-10-14 DIAGNOSIS — R627 Adult failure to thrive: Secondary | ICD-10-CM | POA: Diagnosis not present

## 2017-10-14 DIAGNOSIS — Z8739 Personal history of other diseases of the musculoskeletal system and connective tissue: Secondary | ICD-10-CM | POA: Diagnosis not present

## 2017-10-14 DIAGNOSIS — Z8639 Personal history of other endocrine, nutritional and metabolic disease: Secondary | ICD-10-CM | POA: Diagnosis not present

## 2017-10-15 DIAGNOSIS — Z8639 Personal history of other endocrine, nutritional and metabolic disease: Secondary | ICD-10-CM | POA: Diagnosis not present

## 2017-10-15 DIAGNOSIS — Z8679 Personal history of other diseases of the circulatory system: Secondary | ICD-10-CM | POA: Diagnosis not present

## 2017-10-15 DIAGNOSIS — Z8739 Personal history of other diseases of the musculoskeletal system and connective tissue: Secondary | ICD-10-CM | POA: Diagnosis not present

## 2017-10-15 DIAGNOSIS — Z8673 Personal history of transient ischemic attack (TIA), and cerebral infarction without residual deficits: Secondary | ICD-10-CM | POA: Diagnosis not present

## 2017-10-15 DIAGNOSIS — R627 Adult failure to thrive: Secondary | ICD-10-CM | POA: Diagnosis not present

## 2017-10-15 DIAGNOSIS — I639 Cerebral infarction, unspecified: Secondary | ICD-10-CM | POA: Diagnosis not present

## 2017-10-18 DIAGNOSIS — Z8639 Personal history of other endocrine, nutritional and metabolic disease: Secondary | ICD-10-CM | POA: Diagnosis not present

## 2017-10-18 DIAGNOSIS — R627 Adult failure to thrive: Secondary | ICD-10-CM | POA: Diagnosis not present

## 2017-10-18 DIAGNOSIS — Z8673 Personal history of transient ischemic attack (TIA), and cerebral infarction without residual deficits: Secondary | ICD-10-CM | POA: Diagnosis not present

## 2017-10-18 DIAGNOSIS — I639 Cerebral infarction, unspecified: Secondary | ICD-10-CM | POA: Diagnosis not present

## 2017-10-18 DIAGNOSIS — Z8679 Personal history of other diseases of the circulatory system: Secondary | ICD-10-CM | POA: Diagnosis not present

## 2017-10-18 DIAGNOSIS — Z8739 Personal history of other diseases of the musculoskeletal system and connective tissue: Secondary | ICD-10-CM | POA: Diagnosis not present

## 2017-10-19 DIAGNOSIS — Z8639 Personal history of other endocrine, nutritional and metabolic disease: Secondary | ICD-10-CM | POA: Diagnosis not present

## 2017-10-19 DIAGNOSIS — Z8679 Personal history of other diseases of the circulatory system: Secondary | ICD-10-CM | POA: Diagnosis not present

## 2017-10-19 DIAGNOSIS — Z8673 Personal history of transient ischemic attack (TIA), and cerebral infarction without residual deficits: Secondary | ICD-10-CM | POA: Diagnosis not present

## 2017-10-19 DIAGNOSIS — I639 Cerebral infarction, unspecified: Secondary | ICD-10-CM | POA: Diagnosis not present

## 2017-10-19 DIAGNOSIS — Z8739 Personal history of other diseases of the musculoskeletal system and connective tissue: Secondary | ICD-10-CM | POA: Diagnosis not present

## 2017-10-19 DIAGNOSIS — R627 Adult failure to thrive: Secondary | ICD-10-CM | POA: Diagnosis not present

## 2017-10-20 DIAGNOSIS — Z8739 Personal history of other diseases of the musculoskeletal system and connective tissue: Secondary | ICD-10-CM | POA: Diagnosis not present

## 2017-10-20 DIAGNOSIS — I639 Cerebral infarction, unspecified: Secondary | ICD-10-CM | POA: Diagnosis not present

## 2017-10-20 DIAGNOSIS — Z8679 Personal history of other diseases of the circulatory system: Secondary | ICD-10-CM | POA: Diagnosis not present

## 2017-10-20 DIAGNOSIS — Z8673 Personal history of transient ischemic attack (TIA), and cerebral infarction without residual deficits: Secondary | ICD-10-CM | POA: Diagnosis not present

## 2017-10-20 DIAGNOSIS — Z8639 Personal history of other endocrine, nutritional and metabolic disease: Secondary | ICD-10-CM | POA: Diagnosis not present

## 2017-10-20 DIAGNOSIS — R627 Adult failure to thrive: Secondary | ICD-10-CM | POA: Diagnosis not present

## 2017-10-21 DIAGNOSIS — Z8673 Personal history of transient ischemic attack (TIA), and cerebral infarction without residual deficits: Secondary | ICD-10-CM | POA: Diagnosis not present

## 2017-10-21 DIAGNOSIS — R627 Adult failure to thrive: Secondary | ICD-10-CM | POA: Diagnosis not present

## 2017-10-21 DIAGNOSIS — Z8739 Personal history of other diseases of the musculoskeletal system and connective tissue: Secondary | ICD-10-CM | POA: Diagnosis not present

## 2017-10-21 DIAGNOSIS — I639 Cerebral infarction, unspecified: Secondary | ICD-10-CM | POA: Diagnosis not present

## 2017-10-21 DIAGNOSIS — Z8679 Personal history of other diseases of the circulatory system: Secondary | ICD-10-CM | POA: Diagnosis not present

## 2017-10-21 DIAGNOSIS — Z8639 Personal history of other endocrine, nutritional and metabolic disease: Secondary | ICD-10-CM | POA: Diagnosis not present

## 2017-10-22 DIAGNOSIS — I639 Cerebral infarction, unspecified: Secondary | ICD-10-CM | POA: Diagnosis not present

## 2017-10-22 DIAGNOSIS — Z8673 Personal history of transient ischemic attack (TIA), and cerebral infarction without residual deficits: Secondary | ICD-10-CM | POA: Diagnosis not present

## 2017-10-22 DIAGNOSIS — Z8679 Personal history of other diseases of the circulatory system: Secondary | ICD-10-CM | POA: Diagnosis not present

## 2017-10-22 DIAGNOSIS — R627 Adult failure to thrive: Secondary | ICD-10-CM | POA: Diagnosis not present

## 2017-10-22 DIAGNOSIS — Z8639 Personal history of other endocrine, nutritional and metabolic disease: Secondary | ICD-10-CM | POA: Diagnosis not present

## 2017-10-22 DIAGNOSIS — Z8739 Personal history of other diseases of the musculoskeletal system and connective tissue: Secondary | ICD-10-CM | POA: Diagnosis not present

## 2017-10-23 DIAGNOSIS — Z8639 Personal history of other endocrine, nutritional and metabolic disease: Secondary | ICD-10-CM | POA: Diagnosis not present

## 2017-10-23 DIAGNOSIS — I639 Cerebral infarction, unspecified: Secondary | ICD-10-CM | POA: Diagnosis not present

## 2017-10-23 DIAGNOSIS — Z8673 Personal history of transient ischemic attack (TIA), and cerebral infarction without residual deficits: Secondary | ICD-10-CM | POA: Diagnosis not present

## 2017-10-23 DIAGNOSIS — R627 Adult failure to thrive: Secondary | ICD-10-CM | POA: Diagnosis not present

## 2017-10-23 DIAGNOSIS — Z8739 Personal history of other diseases of the musculoskeletal system and connective tissue: Secondary | ICD-10-CM | POA: Diagnosis not present

## 2017-10-23 DIAGNOSIS — Z8679 Personal history of other diseases of the circulatory system: Secondary | ICD-10-CM | POA: Diagnosis not present

## 2017-10-25 DIAGNOSIS — Z8679 Personal history of other diseases of the circulatory system: Secondary | ICD-10-CM | POA: Diagnosis not present

## 2017-10-25 DIAGNOSIS — R627 Adult failure to thrive: Secondary | ICD-10-CM | POA: Diagnosis not present

## 2017-10-25 DIAGNOSIS — Z8673 Personal history of transient ischemic attack (TIA), and cerebral infarction without residual deficits: Secondary | ICD-10-CM | POA: Diagnosis not present

## 2017-10-25 DIAGNOSIS — Z8639 Personal history of other endocrine, nutritional and metabolic disease: Secondary | ICD-10-CM | POA: Diagnosis not present

## 2017-10-25 DIAGNOSIS — Z8739 Personal history of other diseases of the musculoskeletal system and connective tissue: Secondary | ICD-10-CM | POA: Diagnosis not present

## 2017-10-25 DIAGNOSIS — I639 Cerebral infarction, unspecified: Secondary | ICD-10-CM | POA: Diagnosis not present

## 2017-10-26 DIAGNOSIS — Z8673 Personal history of transient ischemic attack (TIA), and cerebral infarction without residual deficits: Secondary | ICD-10-CM | POA: Diagnosis not present

## 2017-10-26 DIAGNOSIS — I639 Cerebral infarction, unspecified: Secondary | ICD-10-CM | POA: Diagnosis not present

## 2017-10-26 DIAGNOSIS — Z8739 Personal history of other diseases of the musculoskeletal system and connective tissue: Secondary | ICD-10-CM | POA: Diagnosis not present

## 2017-10-26 DIAGNOSIS — Z8639 Personal history of other endocrine, nutritional and metabolic disease: Secondary | ICD-10-CM | POA: Diagnosis not present

## 2017-10-26 DIAGNOSIS — R627 Adult failure to thrive: Secondary | ICD-10-CM | POA: Diagnosis not present

## 2017-10-26 DIAGNOSIS — Z8679 Personal history of other diseases of the circulatory system: Secondary | ICD-10-CM | POA: Diagnosis not present

## 2017-10-27 DIAGNOSIS — I639 Cerebral infarction, unspecified: Secondary | ICD-10-CM | POA: Diagnosis not present

## 2017-10-27 DIAGNOSIS — Z8739 Personal history of other diseases of the musculoskeletal system and connective tissue: Secondary | ICD-10-CM | POA: Diagnosis not present

## 2017-10-27 DIAGNOSIS — Z8679 Personal history of other diseases of the circulatory system: Secondary | ICD-10-CM | POA: Diagnosis not present

## 2017-10-27 DIAGNOSIS — R627 Adult failure to thrive: Secondary | ICD-10-CM | POA: Diagnosis not present

## 2017-10-27 DIAGNOSIS — Z8639 Personal history of other endocrine, nutritional and metabolic disease: Secondary | ICD-10-CM | POA: Diagnosis not present

## 2017-10-27 DIAGNOSIS — Z8673 Personal history of transient ischemic attack (TIA), and cerebral infarction without residual deficits: Secondary | ICD-10-CM | POA: Diagnosis not present

## 2017-10-28 DIAGNOSIS — Z8673 Personal history of transient ischemic attack (TIA), and cerebral infarction without residual deficits: Secondary | ICD-10-CM | POA: Diagnosis not present

## 2017-10-28 DIAGNOSIS — R627 Adult failure to thrive: Secondary | ICD-10-CM | POA: Diagnosis not present

## 2017-10-28 DIAGNOSIS — Z8639 Personal history of other endocrine, nutritional and metabolic disease: Secondary | ICD-10-CM | POA: Diagnosis not present

## 2017-10-28 DIAGNOSIS — Z8679 Personal history of other diseases of the circulatory system: Secondary | ICD-10-CM | POA: Diagnosis not present

## 2017-10-28 DIAGNOSIS — I639 Cerebral infarction, unspecified: Secondary | ICD-10-CM | POA: Diagnosis not present

## 2017-10-28 DIAGNOSIS — Z8739 Personal history of other diseases of the musculoskeletal system and connective tissue: Secondary | ICD-10-CM | POA: Diagnosis not present

## 2017-10-29 DIAGNOSIS — I639 Cerebral infarction, unspecified: Secondary | ICD-10-CM | POA: Diagnosis not present

## 2017-10-29 DIAGNOSIS — Z8739 Personal history of other diseases of the musculoskeletal system and connective tissue: Secondary | ICD-10-CM | POA: Diagnosis not present

## 2017-10-29 DIAGNOSIS — Z8639 Personal history of other endocrine, nutritional and metabolic disease: Secondary | ICD-10-CM | POA: Diagnosis not present

## 2017-10-29 DIAGNOSIS — Z8679 Personal history of other diseases of the circulatory system: Secondary | ICD-10-CM | POA: Diagnosis not present

## 2017-10-29 DIAGNOSIS — Z8673 Personal history of transient ischemic attack (TIA), and cerebral infarction without residual deficits: Secondary | ICD-10-CM | POA: Diagnosis not present

## 2017-10-29 DIAGNOSIS — R627 Adult failure to thrive: Secondary | ICD-10-CM | POA: Diagnosis not present

## 2017-10-31 DIAGNOSIS — Z8679 Personal history of other diseases of the circulatory system: Secondary | ICD-10-CM | POA: Diagnosis not present

## 2017-10-31 DIAGNOSIS — Z8673 Personal history of transient ischemic attack (TIA), and cerebral infarction without residual deficits: Secondary | ICD-10-CM | POA: Diagnosis not present

## 2017-10-31 DIAGNOSIS — Z8639 Personal history of other endocrine, nutritional and metabolic disease: Secondary | ICD-10-CM | POA: Diagnosis not present

## 2017-10-31 DIAGNOSIS — R627 Adult failure to thrive: Secondary | ICD-10-CM | POA: Diagnosis not present

## 2017-10-31 DIAGNOSIS — Z8739 Personal history of other diseases of the musculoskeletal system and connective tissue: Secondary | ICD-10-CM | POA: Diagnosis not present

## 2017-10-31 DIAGNOSIS — I639 Cerebral infarction, unspecified: Secondary | ICD-10-CM | POA: Diagnosis not present

## 2017-11-01 DIAGNOSIS — Z8739 Personal history of other diseases of the musculoskeletal system and connective tissue: Secondary | ICD-10-CM | POA: Diagnosis not present

## 2017-11-01 DIAGNOSIS — R627 Adult failure to thrive: Secondary | ICD-10-CM | POA: Diagnosis not present

## 2017-11-01 DIAGNOSIS — Z8679 Personal history of other diseases of the circulatory system: Secondary | ICD-10-CM | POA: Diagnosis not present

## 2017-11-01 DIAGNOSIS — I639 Cerebral infarction, unspecified: Secondary | ICD-10-CM | POA: Diagnosis not present

## 2017-11-01 DIAGNOSIS — Z8673 Personal history of transient ischemic attack (TIA), and cerebral infarction without residual deficits: Secondary | ICD-10-CM | POA: Diagnosis not present

## 2017-11-01 DIAGNOSIS — Z8639 Personal history of other endocrine, nutritional and metabolic disease: Secondary | ICD-10-CM | POA: Diagnosis not present

## 2017-11-02 DIAGNOSIS — Z8673 Personal history of transient ischemic attack (TIA), and cerebral infarction without residual deficits: Secondary | ICD-10-CM | POA: Diagnosis not present

## 2017-11-02 DIAGNOSIS — I639 Cerebral infarction, unspecified: Secondary | ICD-10-CM | POA: Diagnosis not present

## 2017-11-02 DIAGNOSIS — Z8739 Personal history of other diseases of the musculoskeletal system and connective tissue: Secondary | ICD-10-CM | POA: Diagnosis not present

## 2017-11-02 DIAGNOSIS — Z8639 Personal history of other endocrine, nutritional and metabolic disease: Secondary | ICD-10-CM | POA: Diagnosis not present

## 2017-11-02 DIAGNOSIS — Z8679 Personal history of other diseases of the circulatory system: Secondary | ICD-10-CM | POA: Diagnosis not present

## 2017-11-02 DIAGNOSIS — R627 Adult failure to thrive: Secondary | ICD-10-CM | POA: Diagnosis not present

## 2017-11-03 DIAGNOSIS — Z8639 Personal history of other endocrine, nutritional and metabolic disease: Secondary | ICD-10-CM | POA: Diagnosis not present

## 2017-11-03 DIAGNOSIS — Z8673 Personal history of transient ischemic attack (TIA), and cerebral infarction without residual deficits: Secondary | ICD-10-CM | POA: Diagnosis not present

## 2017-11-03 DIAGNOSIS — R627 Adult failure to thrive: Secondary | ICD-10-CM | POA: Diagnosis not present

## 2017-11-03 DIAGNOSIS — Z8679 Personal history of other diseases of the circulatory system: Secondary | ICD-10-CM | POA: Diagnosis not present

## 2017-11-03 DIAGNOSIS — I639 Cerebral infarction, unspecified: Secondary | ICD-10-CM | POA: Diagnosis not present

## 2017-11-03 DIAGNOSIS — Z8739 Personal history of other diseases of the musculoskeletal system and connective tissue: Secondary | ICD-10-CM | POA: Diagnosis not present

## 2017-11-05 DIAGNOSIS — Z8679 Personal history of other diseases of the circulatory system: Secondary | ICD-10-CM | POA: Diagnosis not present

## 2017-11-05 DIAGNOSIS — I639 Cerebral infarction, unspecified: Secondary | ICD-10-CM | POA: Diagnosis not present

## 2017-11-05 DIAGNOSIS — Z8739 Personal history of other diseases of the musculoskeletal system and connective tissue: Secondary | ICD-10-CM | POA: Diagnosis not present

## 2017-11-05 DIAGNOSIS — Z8673 Personal history of transient ischemic attack (TIA), and cerebral infarction without residual deficits: Secondary | ICD-10-CM | POA: Diagnosis not present

## 2017-11-05 DIAGNOSIS — Z8639 Personal history of other endocrine, nutritional and metabolic disease: Secondary | ICD-10-CM | POA: Diagnosis not present

## 2017-11-05 DIAGNOSIS — R627 Adult failure to thrive: Secondary | ICD-10-CM | POA: Diagnosis not present

## 2017-11-08 DIAGNOSIS — I639 Cerebral infarction, unspecified: Secondary | ICD-10-CM | POA: Diagnosis not present

## 2017-11-08 DIAGNOSIS — Z8679 Personal history of other diseases of the circulatory system: Secondary | ICD-10-CM | POA: Diagnosis not present

## 2017-11-08 DIAGNOSIS — Z8739 Personal history of other diseases of the musculoskeletal system and connective tissue: Secondary | ICD-10-CM | POA: Diagnosis not present

## 2017-11-08 DIAGNOSIS — Z8673 Personal history of transient ischemic attack (TIA), and cerebral infarction without residual deficits: Secondary | ICD-10-CM | POA: Diagnosis not present

## 2017-11-08 DIAGNOSIS — R627 Adult failure to thrive: Secondary | ICD-10-CM | POA: Diagnosis not present

## 2017-11-08 DIAGNOSIS — Z8639 Personal history of other endocrine, nutritional and metabolic disease: Secondary | ICD-10-CM | POA: Diagnosis not present

## 2017-11-09 DIAGNOSIS — Z8639 Personal history of other endocrine, nutritional and metabolic disease: Secondary | ICD-10-CM | POA: Diagnosis not present

## 2017-11-09 DIAGNOSIS — R627 Adult failure to thrive: Secondary | ICD-10-CM | POA: Diagnosis not present

## 2017-11-09 DIAGNOSIS — Z8739 Personal history of other diseases of the musculoskeletal system and connective tissue: Secondary | ICD-10-CM | POA: Diagnosis not present

## 2017-11-09 DIAGNOSIS — Z8679 Personal history of other diseases of the circulatory system: Secondary | ICD-10-CM | POA: Diagnosis not present

## 2017-11-09 DIAGNOSIS — Z8673 Personal history of transient ischemic attack (TIA), and cerebral infarction without residual deficits: Secondary | ICD-10-CM | POA: Diagnosis not present

## 2017-11-09 DIAGNOSIS — I639 Cerebral infarction, unspecified: Secondary | ICD-10-CM | POA: Diagnosis not present

## 2017-11-10 DIAGNOSIS — I639 Cerebral infarction, unspecified: Secondary | ICD-10-CM | POA: Diagnosis not present

## 2017-11-10 DIAGNOSIS — Z8739 Personal history of other diseases of the musculoskeletal system and connective tissue: Secondary | ICD-10-CM | POA: Diagnosis not present

## 2017-11-10 DIAGNOSIS — Z8679 Personal history of other diseases of the circulatory system: Secondary | ICD-10-CM | POA: Diagnosis not present

## 2017-11-10 DIAGNOSIS — Z8673 Personal history of transient ischemic attack (TIA), and cerebral infarction without residual deficits: Secondary | ICD-10-CM | POA: Diagnosis not present

## 2017-11-10 DIAGNOSIS — Z8639 Personal history of other endocrine, nutritional and metabolic disease: Secondary | ICD-10-CM | POA: Diagnosis not present

## 2017-11-10 DIAGNOSIS — R627 Adult failure to thrive: Secondary | ICD-10-CM | POA: Diagnosis not present

## 2017-11-11 DIAGNOSIS — Z8673 Personal history of transient ischemic attack (TIA), and cerebral infarction without residual deficits: Secondary | ICD-10-CM | POA: Diagnosis not present

## 2017-11-11 DIAGNOSIS — R627 Adult failure to thrive: Secondary | ICD-10-CM | POA: Diagnosis not present

## 2017-11-11 DIAGNOSIS — Z8679 Personal history of other diseases of the circulatory system: Secondary | ICD-10-CM | POA: Diagnosis not present

## 2017-11-11 DIAGNOSIS — I639 Cerebral infarction, unspecified: Secondary | ICD-10-CM | POA: Diagnosis not present

## 2017-11-11 DIAGNOSIS — Z8739 Personal history of other diseases of the musculoskeletal system and connective tissue: Secondary | ICD-10-CM | POA: Diagnosis not present

## 2017-11-11 DIAGNOSIS — Z8639 Personal history of other endocrine, nutritional and metabolic disease: Secondary | ICD-10-CM | POA: Diagnosis not present

## 2017-11-12 DIAGNOSIS — R627 Adult failure to thrive: Secondary | ICD-10-CM | POA: Diagnosis not present

## 2017-11-12 DIAGNOSIS — Z8669 Personal history of other diseases of the nervous system and sense organs: Secondary | ICD-10-CM | POA: Diagnosis not present

## 2017-11-12 DIAGNOSIS — I639 Cerebral infarction, unspecified: Secondary | ICD-10-CM | POA: Diagnosis not present

## 2017-11-12 DIAGNOSIS — Z8739 Personal history of other diseases of the musculoskeletal system and connective tissue: Secondary | ICD-10-CM | POA: Diagnosis not present

## 2017-11-12 DIAGNOSIS — Z8719 Personal history of other diseases of the digestive system: Secondary | ICD-10-CM | POA: Diagnosis not present

## 2017-11-12 DIAGNOSIS — Z8673 Personal history of transient ischemic attack (TIA), and cerebral infarction without residual deficits: Secondary | ICD-10-CM | POA: Diagnosis not present

## 2017-11-12 DIAGNOSIS — Z8679 Personal history of other diseases of the circulatory system: Secondary | ICD-10-CM | POA: Diagnosis not present

## 2017-11-12 DIAGNOSIS — Z8639 Personal history of other endocrine, nutritional and metabolic disease: Secondary | ICD-10-CM | POA: Diagnosis not present

## 2017-11-15 DIAGNOSIS — Z8673 Personal history of transient ischemic attack (TIA), and cerebral infarction without residual deficits: Secondary | ICD-10-CM | POA: Diagnosis not present

## 2017-11-15 DIAGNOSIS — Z8639 Personal history of other endocrine, nutritional and metabolic disease: Secondary | ICD-10-CM | POA: Diagnosis not present

## 2017-11-15 DIAGNOSIS — Z8739 Personal history of other diseases of the musculoskeletal system and connective tissue: Secondary | ICD-10-CM | POA: Diagnosis not present

## 2017-11-15 DIAGNOSIS — R627 Adult failure to thrive: Secondary | ICD-10-CM | POA: Diagnosis not present

## 2017-11-15 DIAGNOSIS — Z8679 Personal history of other diseases of the circulatory system: Secondary | ICD-10-CM | POA: Diagnosis not present

## 2017-11-15 DIAGNOSIS — I639 Cerebral infarction, unspecified: Secondary | ICD-10-CM | POA: Diagnosis not present

## 2017-11-16 DIAGNOSIS — Z8673 Personal history of transient ischemic attack (TIA), and cerebral infarction without residual deficits: Secondary | ICD-10-CM | POA: Diagnosis not present

## 2017-11-16 DIAGNOSIS — Z8679 Personal history of other diseases of the circulatory system: Secondary | ICD-10-CM | POA: Diagnosis not present

## 2017-11-16 DIAGNOSIS — I639 Cerebral infarction, unspecified: Secondary | ICD-10-CM | POA: Diagnosis not present

## 2017-11-16 DIAGNOSIS — R627 Adult failure to thrive: Secondary | ICD-10-CM | POA: Diagnosis not present

## 2017-11-16 DIAGNOSIS — Z8639 Personal history of other endocrine, nutritional and metabolic disease: Secondary | ICD-10-CM | POA: Diagnosis not present

## 2017-11-16 DIAGNOSIS — Z8739 Personal history of other diseases of the musculoskeletal system and connective tissue: Secondary | ICD-10-CM | POA: Diagnosis not present

## 2017-11-17 DIAGNOSIS — Z8673 Personal history of transient ischemic attack (TIA), and cerebral infarction without residual deficits: Secondary | ICD-10-CM | POA: Diagnosis not present

## 2017-11-17 DIAGNOSIS — I639 Cerebral infarction, unspecified: Secondary | ICD-10-CM | POA: Diagnosis not present

## 2017-11-17 DIAGNOSIS — Z8639 Personal history of other endocrine, nutritional and metabolic disease: Secondary | ICD-10-CM | POA: Diagnosis not present

## 2017-11-17 DIAGNOSIS — R627 Adult failure to thrive: Secondary | ICD-10-CM | POA: Diagnosis not present

## 2017-11-17 DIAGNOSIS — Z8739 Personal history of other diseases of the musculoskeletal system and connective tissue: Secondary | ICD-10-CM | POA: Diagnosis not present

## 2017-11-17 DIAGNOSIS — Z8679 Personal history of other diseases of the circulatory system: Secondary | ICD-10-CM | POA: Diagnosis not present

## 2017-11-19 DIAGNOSIS — Z8673 Personal history of transient ischemic attack (TIA), and cerebral infarction without residual deficits: Secondary | ICD-10-CM | POA: Diagnosis not present

## 2017-11-19 DIAGNOSIS — Z8739 Personal history of other diseases of the musculoskeletal system and connective tissue: Secondary | ICD-10-CM | POA: Diagnosis not present

## 2017-11-19 DIAGNOSIS — I639 Cerebral infarction, unspecified: Secondary | ICD-10-CM | POA: Diagnosis not present

## 2017-11-19 DIAGNOSIS — R627 Adult failure to thrive: Secondary | ICD-10-CM | POA: Diagnosis not present

## 2017-11-19 DIAGNOSIS — Z8679 Personal history of other diseases of the circulatory system: Secondary | ICD-10-CM | POA: Diagnosis not present

## 2017-11-19 DIAGNOSIS — Z8639 Personal history of other endocrine, nutritional and metabolic disease: Secondary | ICD-10-CM | POA: Diagnosis not present

## 2017-11-22 DIAGNOSIS — R627 Adult failure to thrive: Secondary | ICD-10-CM | POA: Diagnosis not present

## 2017-11-22 DIAGNOSIS — Z8679 Personal history of other diseases of the circulatory system: Secondary | ICD-10-CM | POA: Diagnosis not present

## 2017-11-22 DIAGNOSIS — Z8739 Personal history of other diseases of the musculoskeletal system and connective tissue: Secondary | ICD-10-CM | POA: Diagnosis not present

## 2017-11-22 DIAGNOSIS — Z8673 Personal history of transient ischemic attack (TIA), and cerebral infarction without residual deficits: Secondary | ICD-10-CM | POA: Diagnosis not present

## 2017-11-22 DIAGNOSIS — I639 Cerebral infarction, unspecified: Secondary | ICD-10-CM | POA: Diagnosis not present

## 2017-11-22 DIAGNOSIS — Z8639 Personal history of other endocrine, nutritional and metabolic disease: Secondary | ICD-10-CM | POA: Diagnosis not present

## 2017-11-24 DIAGNOSIS — Z8679 Personal history of other diseases of the circulatory system: Secondary | ICD-10-CM | POA: Diagnosis not present

## 2017-11-24 DIAGNOSIS — Z8673 Personal history of transient ischemic attack (TIA), and cerebral infarction without residual deficits: Secondary | ICD-10-CM | POA: Diagnosis not present

## 2017-11-24 DIAGNOSIS — R627 Adult failure to thrive: Secondary | ICD-10-CM | POA: Diagnosis not present

## 2017-11-24 DIAGNOSIS — I639 Cerebral infarction, unspecified: Secondary | ICD-10-CM | POA: Diagnosis not present

## 2017-11-24 DIAGNOSIS — Z8739 Personal history of other diseases of the musculoskeletal system and connective tissue: Secondary | ICD-10-CM | POA: Diagnosis not present

## 2017-11-24 DIAGNOSIS — Z8639 Personal history of other endocrine, nutritional and metabolic disease: Secondary | ICD-10-CM | POA: Diagnosis not present

## 2017-11-25 DIAGNOSIS — Z8639 Personal history of other endocrine, nutritional and metabolic disease: Secondary | ICD-10-CM | POA: Diagnosis not present

## 2017-11-25 DIAGNOSIS — Z8673 Personal history of transient ischemic attack (TIA), and cerebral infarction without residual deficits: Secondary | ICD-10-CM | POA: Diagnosis not present

## 2017-11-25 DIAGNOSIS — Z8739 Personal history of other diseases of the musculoskeletal system and connective tissue: Secondary | ICD-10-CM | POA: Diagnosis not present

## 2017-11-25 DIAGNOSIS — Z8679 Personal history of other diseases of the circulatory system: Secondary | ICD-10-CM | POA: Diagnosis not present

## 2017-11-25 DIAGNOSIS — I639 Cerebral infarction, unspecified: Secondary | ICD-10-CM | POA: Diagnosis not present

## 2017-11-25 DIAGNOSIS — R627 Adult failure to thrive: Secondary | ICD-10-CM | POA: Diagnosis not present

## 2017-11-26 DIAGNOSIS — R627 Adult failure to thrive: Secondary | ICD-10-CM | POA: Diagnosis not present

## 2017-11-26 DIAGNOSIS — Z8639 Personal history of other endocrine, nutritional and metabolic disease: Secondary | ICD-10-CM | POA: Diagnosis not present

## 2017-11-26 DIAGNOSIS — Z8739 Personal history of other diseases of the musculoskeletal system and connective tissue: Secondary | ICD-10-CM | POA: Diagnosis not present

## 2017-11-26 DIAGNOSIS — Z8679 Personal history of other diseases of the circulatory system: Secondary | ICD-10-CM | POA: Diagnosis not present

## 2017-11-26 DIAGNOSIS — Z8673 Personal history of transient ischemic attack (TIA), and cerebral infarction without residual deficits: Secondary | ICD-10-CM | POA: Diagnosis not present

## 2017-11-26 DIAGNOSIS — I639 Cerebral infarction, unspecified: Secondary | ICD-10-CM | POA: Diagnosis not present

## 2017-11-29 DIAGNOSIS — Z8739 Personal history of other diseases of the musculoskeletal system and connective tissue: Secondary | ICD-10-CM | POA: Diagnosis not present

## 2017-11-29 DIAGNOSIS — Z8679 Personal history of other diseases of the circulatory system: Secondary | ICD-10-CM | POA: Diagnosis not present

## 2017-11-29 DIAGNOSIS — R627 Adult failure to thrive: Secondary | ICD-10-CM | POA: Diagnosis not present

## 2017-11-29 DIAGNOSIS — Z8639 Personal history of other endocrine, nutritional and metabolic disease: Secondary | ICD-10-CM | POA: Diagnosis not present

## 2017-11-29 DIAGNOSIS — I639 Cerebral infarction, unspecified: Secondary | ICD-10-CM | POA: Diagnosis not present

## 2017-11-29 DIAGNOSIS — Z8673 Personal history of transient ischemic attack (TIA), and cerebral infarction without residual deficits: Secondary | ICD-10-CM | POA: Diagnosis not present

## 2017-11-30 DIAGNOSIS — R627 Adult failure to thrive: Secondary | ICD-10-CM | POA: Diagnosis not present

## 2017-11-30 DIAGNOSIS — Z8679 Personal history of other diseases of the circulatory system: Secondary | ICD-10-CM | POA: Diagnosis not present

## 2017-11-30 DIAGNOSIS — Z8673 Personal history of transient ischemic attack (TIA), and cerebral infarction without residual deficits: Secondary | ICD-10-CM | POA: Diagnosis not present

## 2017-11-30 DIAGNOSIS — Z8639 Personal history of other endocrine, nutritional and metabolic disease: Secondary | ICD-10-CM | POA: Diagnosis not present

## 2017-11-30 DIAGNOSIS — Z8739 Personal history of other diseases of the musculoskeletal system and connective tissue: Secondary | ICD-10-CM | POA: Diagnosis not present

## 2017-11-30 DIAGNOSIS — I639 Cerebral infarction, unspecified: Secondary | ICD-10-CM | POA: Diagnosis not present

## 2017-12-01 DIAGNOSIS — Z8673 Personal history of transient ischemic attack (TIA), and cerebral infarction without residual deficits: Secondary | ICD-10-CM | POA: Diagnosis not present

## 2017-12-01 DIAGNOSIS — Z8639 Personal history of other endocrine, nutritional and metabolic disease: Secondary | ICD-10-CM | POA: Diagnosis not present

## 2017-12-01 DIAGNOSIS — Z8679 Personal history of other diseases of the circulatory system: Secondary | ICD-10-CM | POA: Diagnosis not present

## 2017-12-01 DIAGNOSIS — R627 Adult failure to thrive: Secondary | ICD-10-CM | POA: Diagnosis not present

## 2017-12-01 DIAGNOSIS — I639 Cerebral infarction, unspecified: Secondary | ICD-10-CM | POA: Diagnosis not present

## 2017-12-01 DIAGNOSIS — Z8739 Personal history of other diseases of the musculoskeletal system and connective tissue: Secondary | ICD-10-CM | POA: Diagnosis not present

## 2017-12-02 DIAGNOSIS — Z8673 Personal history of transient ischemic attack (TIA), and cerebral infarction without residual deficits: Secondary | ICD-10-CM | POA: Diagnosis not present

## 2017-12-02 DIAGNOSIS — Z8679 Personal history of other diseases of the circulatory system: Secondary | ICD-10-CM | POA: Diagnosis not present

## 2017-12-02 DIAGNOSIS — Z8739 Personal history of other diseases of the musculoskeletal system and connective tissue: Secondary | ICD-10-CM | POA: Diagnosis not present

## 2017-12-02 DIAGNOSIS — Z8639 Personal history of other endocrine, nutritional and metabolic disease: Secondary | ICD-10-CM | POA: Diagnosis not present

## 2017-12-02 DIAGNOSIS — I639 Cerebral infarction, unspecified: Secondary | ICD-10-CM | POA: Diagnosis not present

## 2017-12-02 DIAGNOSIS — R627 Adult failure to thrive: Secondary | ICD-10-CM | POA: Diagnosis not present

## 2017-12-03 DIAGNOSIS — Z8739 Personal history of other diseases of the musculoskeletal system and connective tissue: Secondary | ICD-10-CM | POA: Diagnosis not present

## 2017-12-03 DIAGNOSIS — R627 Adult failure to thrive: Secondary | ICD-10-CM | POA: Diagnosis not present

## 2017-12-03 DIAGNOSIS — I639 Cerebral infarction, unspecified: Secondary | ICD-10-CM | POA: Diagnosis not present

## 2017-12-03 DIAGNOSIS — Z8639 Personal history of other endocrine, nutritional and metabolic disease: Secondary | ICD-10-CM | POA: Diagnosis not present

## 2017-12-03 DIAGNOSIS — Z8679 Personal history of other diseases of the circulatory system: Secondary | ICD-10-CM | POA: Diagnosis not present

## 2017-12-03 DIAGNOSIS — Z8673 Personal history of transient ischemic attack (TIA), and cerebral infarction without residual deficits: Secondary | ICD-10-CM | POA: Diagnosis not present

## 2017-12-06 DIAGNOSIS — I639 Cerebral infarction, unspecified: Secondary | ICD-10-CM | POA: Diagnosis not present

## 2017-12-06 DIAGNOSIS — R627 Adult failure to thrive: Secondary | ICD-10-CM | POA: Diagnosis not present

## 2017-12-06 DIAGNOSIS — Z8679 Personal history of other diseases of the circulatory system: Secondary | ICD-10-CM | POA: Diagnosis not present

## 2017-12-06 DIAGNOSIS — Z8739 Personal history of other diseases of the musculoskeletal system and connective tissue: Secondary | ICD-10-CM | POA: Diagnosis not present

## 2017-12-06 DIAGNOSIS — Z8673 Personal history of transient ischemic attack (TIA), and cerebral infarction without residual deficits: Secondary | ICD-10-CM | POA: Diagnosis not present

## 2017-12-06 DIAGNOSIS — Z8639 Personal history of other endocrine, nutritional and metabolic disease: Secondary | ICD-10-CM | POA: Diagnosis not present

## 2017-12-07 DIAGNOSIS — Z8639 Personal history of other endocrine, nutritional and metabolic disease: Secondary | ICD-10-CM | POA: Diagnosis not present

## 2017-12-07 DIAGNOSIS — Z8673 Personal history of transient ischemic attack (TIA), and cerebral infarction without residual deficits: Secondary | ICD-10-CM | POA: Diagnosis not present

## 2017-12-07 DIAGNOSIS — Z8739 Personal history of other diseases of the musculoskeletal system and connective tissue: Secondary | ICD-10-CM | POA: Diagnosis not present

## 2017-12-07 DIAGNOSIS — I639 Cerebral infarction, unspecified: Secondary | ICD-10-CM | POA: Diagnosis not present

## 2017-12-07 DIAGNOSIS — Z8679 Personal history of other diseases of the circulatory system: Secondary | ICD-10-CM | POA: Diagnosis not present

## 2017-12-07 DIAGNOSIS — R627 Adult failure to thrive: Secondary | ICD-10-CM | POA: Diagnosis not present

## 2017-12-08 DIAGNOSIS — R627 Adult failure to thrive: Secondary | ICD-10-CM | POA: Diagnosis not present

## 2017-12-08 DIAGNOSIS — Z8679 Personal history of other diseases of the circulatory system: Secondary | ICD-10-CM | POA: Diagnosis not present

## 2017-12-08 DIAGNOSIS — I639 Cerebral infarction, unspecified: Secondary | ICD-10-CM | POA: Diagnosis not present

## 2017-12-08 DIAGNOSIS — Z8739 Personal history of other diseases of the musculoskeletal system and connective tissue: Secondary | ICD-10-CM | POA: Diagnosis not present

## 2017-12-08 DIAGNOSIS — Z8673 Personal history of transient ischemic attack (TIA), and cerebral infarction without residual deficits: Secondary | ICD-10-CM | POA: Diagnosis not present

## 2017-12-08 DIAGNOSIS — Z8639 Personal history of other endocrine, nutritional and metabolic disease: Secondary | ICD-10-CM | POA: Diagnosis not present

## 2017-12-09 DIAGNOSIS — R627 Adult failure to thrive: Secondary | ICD-10-CM | POA: Diagnosis not present

## 2017-12-09 DIAGNOSIS — I639 Cerebral infarction, unspecified: Secondary | ICD-10-CM | POA: Diagnosis not present

## 2017-12-09 DIAGNOSIS — Z8673 Personal history of transient ischemic attack (TIA), and cerebral infarction without residual deficits: Secondary | ICD-10-CM | POA: Diagnosis not present

## 2017-12-09 DIAGNOSIS — Z8739 Personal history of other diseases of the musculoskeletal system and connective tissue: Secondary | ICD-10-CM | POA: Diagnosis not present

## 2017-12-09 DIAGNOSIS — Z8639 Personal history of other endocrine, nutritional and metabolic disease: Secondary | ICD-10-CM | POA: Diagnosis not present

## 2017-12-09 DIAGNOSIS — Z8679 Personal history of other diseases of the circulatory system: Secondary | ICD-10-CM | POA: Diagnosis not present

## 2017-12-10 DIAGNOSIS — I639 Cerebral infarction, unspecified: Secondary | ICD-10-CM | POA: Diagnosis not present

## 2017-12-10 DIAGNOSIS — Z8739 Personal history of other diseases of the musculoskeletal system and connective tissue: Secondary | ICD-10-CM | POA: Diagnosis not present

## 2017-12-10 DIAGNOSIS — Z8719 Personal history of other diseases of the digestive system: Secondary | ICD-10-CM | POA: Diagnosis not present

## 2017-12-10 DIAGNOSIS — Z8673 Personal history of transient ischemic attack (TIA), and cerebral infarction without residual deficits: Secondary | ICD-10-CM | POA: Diagnosis not present

## 2017-12-10 DIAGNOSIS — Z8669 Personal history of other diseases of the nervous system and sense organs: Secondary | ICD-10-CM | POA: Diagnosis not present

## 2017-12-10 DIAGNOSIS — Z8679 Personal history of other diseases of the circulatory system: Secondary | ICD-10-CM | POA: Diagnosis not present

## 2017-12-10 DIAGNOSIS — R627 Adult failure to thrive: Secondary | ICD-10-CM | POA: Diagnosis not present

## 2017-12-10 DIAGNOSIS — Z8639 Personal history of other endocrine, nutritional and metabolic disease: Secondary | ICD-10-CM | POA: Diagnosis not present

## 2017-12-11 DIAGNOSIS — Z8673 Personal history of transient ischemic attack (TIA), and cerebral infarction without residual deficits: Secondary | ICD-10-CM | POA: Diagnosis not present

## 2017-12-11 DIAGNOSIS — I639 Cerebral infarction, unspecified: Secondary | ICD-10-CM | POA: Diagnosis not present

## 2017-12-11 DIAGNOSIS — R627 Adult failure to thrive: Secondary | ICD-10-CM | POA: Diagnosis not present

## 2017-12-11 DIAGNOSIS — Z8679 Personal history of other diseases of the circulatory system: Secondary | ICD-10-CM | POA: Diagnosis not present

## 2017-12-11 DIAGNOSIS — Z8739 Personal history of other diseases of the musculoskeletal system and connective tissue: Secondary | ICD-10-CM | POA: Diagnosis not present

## 2017-12-11 DIAGNOSIS — Z8639 Personal history of other endocrine, nutritional and metabolic disease: Secondary | ICD-10-CM | POA: Diagnosis not present

## 2017-12-12 DIAGNOSIS — Z8739 Personal history of other diseases of the musculoskeletal system and connective tissue: Secondary | ICD-10-CM | POA: Diagnosis not present

## 2017-12-12 DIAGNOSIS — R627 Adult failure to thrive: Secondary | ICD-10-CM | POA: Diagnosis not present

## 2017-12-12 DIAGNOSIS — Z8679 Personal history of other diseases of the circulatory system: Secondary | ICD-10-CM | POA: Diagnosis not present

## 2017-12-12 DIAGNOSIS — I639 Cerebral infarction, unspecified: Secondary | ICD-10-CM | POA: Diagnosis not present

## 2017-12-12 DIAGNOSIS — Z8673 Personal history of transient ischemic attack (TIA), and cerebral infarction without residual deficits: Secondary | ICD-10-CM | POA: Diagnosis not present

## 2017-12-12 DIAGNOSIS — Z8639 Personal history of other endocrine, nutritional and metabolic disease: Secondary | ICD-10-CM | POA: Diagnosis not present

## 2017-12-13 DIAGNOSIS — I639 Cerebral infarction, unspecified: Secondary | ICD-10-CM | POA: Diagnosis not present

## 2017-12-13 DIAGNOSIS — Z8679 Personal history of other diseases of the circulatory system: Secondary | ICD-10-CM | POA: Diagnosis not present

## 2017-12-13 DIAGNOSIS — Z8639 Personal history of other endocrine, nutritional and metabolic disease: Secondary | ICD-10-CM | POA: Diagnosis not present

## 2017-12-13 DIAGNOSIS — Z8673 Personal history of transient ischemic attack (TIA), and cerebral infarction without residual deficits: Secondary | ICD-10-CM | POA: Diagnosis not present

## 2017-12-13 DIAGNOSIS — Z8739 Personal history of other diseases of the musculoskeletal system and connective tissue: Secondary | ICD-10-CM | POA: Diagnosis not present

## 2017-12-13 DIAGNOSIS — R627 Adult failure to thrive: Secondary | ICD-10-CM | POA: Diagnosis not present

## 2017-12-14 DIAGNOSIS — Z8679 Personal history of other diseases of the circulatory system: Secondary | ICD-10-CM | POA: Diagnosis not present

## 2017-12-14 DIAGNOSIS — R627 Adult failure to thrive: Secondary | ICD-10-CM | POA: Diagnosis not present

## 2017-12-14 DIAGNOSIS — Z8639 Personal history of other endocrine, nutritional and metabolic disease: Secondary | ICD-10-CM | POA: Diagnosis not present

## 2017-12-14 DIAGNOSIS — I639 Cerebral infarction, unspecified: Secondary | ICD-10-CM | POA: Diagnosis not present

## 2017-12-14 DIAGNOSIS — Z8673 Personal history of transient ischemic attack (TIA), and cerebral infarction without residual deficits: Secondary | ICD-10-CM | POA: Diagnosis not present

## 2017-12-14 DIAGNOSIS — Z8739 Personal history of other diseases of the musculoskeletal system and connective tissue: Secondary | ICD-10-CM | POA: Diagnosis not present

## 2017-12-15 DIAGNOSIS — Z8673 Personal history of transient ischemic attack (TIA), and cerebral infarction without residual deficits: Secondary | ICD-10-CM | POA: Diagnosis not present

## 2017-12-15 DIAGNOSIS — Z8739 Personal history of other diseases of the musculoskeletal system and connective tissue: Secondary | ICD-10-CM | POA: Diagnosis not present

## 2017-12-15 DIAGNOSIS — R627 Adult failure to thrive: Secondary | ICD-10-CM | POA: Diagnosis not present

## 2017-12-15 DIAGNOSIS — Z8639 Personal history of other endocrine, nutritional and metabolic disease: Secondary | ICD-10-CM | POA: Diagnosis not present

## 2017-12-15 DIAGNOSIS — I639 Cerebral infarction, unspecified: Secondary | ICD-10-CM | POA: Diagnosis not present

## 2017-12-15 DIAGNOSIS — Z8679 Personal history of other diseases of the circulatory system: Secondary | ICD-10-CM | POA: Diagnosis not present

## 2017-12-16 DIAGNOSIS — Z8673 Personal history of transient ischemic attack (TIA), and cerebral infarction without residual deficits: Secondary | ICD-10-CM | POA: Diagnosis not present

## 2017-12-16 DIAGNOSIS — Z8639 Personal history of other endocrine, nutritional and metabolic disease: Secondary | ICD-10-CM | POA: Diagnosis not present

## 2017-12-16 DIAGNOSIS — Z8739 Personal history of other diseases of the musculoskeletal system and connective tissue: Secondary | ICD-10-CM | POA: Diagnosis not present

## 2017-12-16 DIAGNOSIS — I639 Cerebral infarction, unspecified: Secondary | ICD-10-CM | POA: Diagnosis not present

## 2017-12-16 DIAGNOSIS — R627 Adult failure to thrive: Secondary | ICD-10-CM | POA: Diagnosis not present

## 2017-12-16 DIAGNOSIS — Z8679 Personal history of other diseases of the circulatory system: Secondary | ICD-10-CM | POA: Diagnosis not present

## 2017-12-17 DIAGNOSIS — R627 Adult failure to thrive: Secondary | ICD-10-CM | POA: Diagnosis not present

## 2017-12-17 DIAGNOSIS — Z8673 Personal history of transient ischemic attack (TIA), and cerebral infarction without residual deficits: Secondary | ICD-10-CM | POA: Diagnosis not present

## 2017-12-17 DIAGNOSIS — Z8679 Personal history of other diseases of the circulatory system: Secondary | ICD-10-CM | POA: Diagnosis not present

## 2017-12-17 DIAGNOSIS — Z8639 Personal history of other endocrine, nutritional and metabolic disease: Secondary | ICD-10-CM | POA: Diagnosis not present

## 2017-12-17 DIAGNOSIS — Z8739 Personal history of other diseases of the musculoskeletal system and connective tissue: Secondary | ICD-10-CM | POA: Diagnosis not present

## 2017-12-17 DIAGNOSIS — I639 Cerebral infarction, unspecified: Secondary | ICD-10-CM | POA: Diagnosis not present

## 2017-12-18 DIAGNOSIS — I639 Cerebral infarction, unspecified: Secondary | ICD-10-CM | POA: Diagnosis not present

## 2017-12-18 DIAGNOSIS — Z8739 Personal history of other diseases of the musculoskeletal system and connective tissue: Secondary | ICD-10-CM | POA: Diagnosis not present

## 2017-12-18 DIAGNOSIS — Z8639 Personal history of other endocrine, nutritional and metabolic disease: Secondary | ICD-10-CM | POA: Diagnosis not present

## 2017-12-18 DIAGNOSIS — R627 Adult failure to thrive: Secondary | ICD-10-CM | POA: Diagnosis not present

## 2017-12-18 DIAGNOSIS — Z8673 Personal history of transient ischemic attack (TIA), and cerebral infarction without residual deficits: Secondary | ICD-10-CM | POA: Diagnosis not present

## 2017-12-18 DIAGNOSIS — Z8679 Personal history of other diseases of the circulatory system: Secondary | ICD-10-CM | POA: Diagnosis not present

## 2017-12-19 DIAGNOSIS — R627 Adult failure to thrive: Secondary | ICD-10-CM | POA: Diagnosis not present

## 2017-12-19 DIAGNOSIS — Z8679 Personal history of other diseases of the circulatory system: Secondary | ICD-10-CM | POA: Diagnosis not present

## 2017-12-19 DIAGNOSIS — Z8673 Personal history of transient ischemic attack (TIA), and cerebral infarction without residual deficits: Secondary | ICD-10-CM | POA: Diagnosis not present

## 2017-12-19 DIAGNOSIS — I639 Cerebral infarction, unspecified: Secondary | ICD-10-CM | POA: Diagnosis not present

## 2017-12-19 DIAGNOSIS — Z8639 Personal history of other endocrine, nutritional and metabolic disease: Secondary | ICD-10-CM | POA: Diagnosis not present

## 2017-12-19 DIAGNOSIS — Z8739 Personal history of other diseases of the musculoskeletal system and connective tissue: Secondary | ICD-10-CM | POA: Diagnosis not present

## 2017-12-20 DIAGNOSIS — Z8739 Personal history of other diseases of the musculoskeletal system and connective tissue: Secondary | ICD-10-CM | POA: Diagnosis not present

## 2017-12-20 DIAGNOSIS — Z8673 Personal history of transient ischemic attack (TIA), and cerebral infarction without residual deficits: Secondary | ICD-10-CM | POA: Diagnosis not present

## 2017-12-20 DIAGNOSIS — I639 Cerebral infarction, unspecified: Secondary | ICD-10-CM | POA: Diagnosis not present

## 2017-12-20 DIAGNOSIS — Z8639 Personal history of other endocrine, nutritional and metabolic disease: Secondary | ICD-10-CM | POA: Diagnosis not present

## 2017-12-20 DIAGNOSIS — R627 Adult failure to thrive: Secondary | ICD-10-CM | POA: Diagnosis not present

## 2017-12-20 DIAGNOSIS — Z8679 Personal history of other diseases of the circulatory system: Secondary | ICD-10-CM | POA: Diagnosis not present

## 2018-01-10 DEATH — deceased
